# Patient Record
Sex: Female | Born: 2000 | Race: Black or African American | Hispanic: No | Smoking: Never smoker
Health system: Southern US, Community
[De-identification: ages and names within clinical notes are randomized; demographics above are authoritative.]

## PROBLEM LIST (undated history)

## (undated) DIAGNOSIS — L309 Dermatitis, unspecified: Secondary | ICD-10-CM

## (undated) DIAGNOSIS — F419 Anxiety disorder, unspecified: Secondary | ICD-10-CM

## (undated) DIAGNOSIS — E063 Autoimmune thyroiditis: Secondary | ICD-10-CM

## (undated) DIAGNOSIS — F32A Depression, unspecified: Secondary | ICD-10-CM

## (undated) DIAGNOSIS — E119 Type 2 diabetes mellitus without complications: Secondary | ICD-10-CM

## (undated) HISTORY — DX: Type 2 diabetes mellitus without complications: E11.9

## (undated) HISTORY — DX: Autoimmune thyroiditis: E06.3

## (undated) HISTORY — PX: HERNIA REPAIR: SHX51

## (undated) HISTORY — DX: Dermatitis, unspecified: L30.9

## (undated) HISTORY — DX: Depression, unspecified: F32.A

## (undated) HISTORY — DX: Anxiety disorder, unspecified: F41.9

---
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
## 2018-04-07 ENCOUNTER — Emergency Department (HOSPITAL_COMMUNITY)
Admission: EM | Admit: 2018-04-07 | Discharge: 2018-04-07 | Disposition: A | Discharge status: Home / Self Care | Payer: No Typology Code available for payment source | Attending: Emergency Medicine | Admitting: Emergency Medicine

## 2018-04-07 ENCOUNTER — Encounter (HOSPITAL_COMMUNITY): Payer: Self-pay | Admitting: Emergency Medicine

## 2018-04-07 ENCOUNTER — Emergency Department (HOSPITAL_COMMUNITY): Payer: No Typology Code available for payment source

## 2018-04-07 DIAGNOSIS — H5789 Other specified disorders of eye and adnexa: Secondary | ICD-10-CM | POA: Diagnosis not present

## 2018-04-07 DIAGNOSIS — T7840XA Allergy, unspecified, initial encounter: Secondary | ICD-10-CM | POA: Diagnosis not present

## 2018-04-07 DIAGNOSIS — R0789 Other chest pain: Secondary | ICD-10-CM | POA: Insufficient documentation

## 2018-04-07 DIAGNOSIS — R067 Sneezing: Secondary | ICD-10-CM | POA: Diagnosis present

## 2018-04-07 DIAGNOSIS — R05 Cough: Secondary | ICD-10-CM | POA: Insufficient documentation

## 2018-04-07 LAB — URINALYSIS, ROUTINE W REFLEX MICROSCOPIC
Bilirubin Urine: NEGATIVE
Glucose, UA: NEGATIVE mg/dL
Ketones, ur: NEGATIVE mg/dL
NITRITE: NEGATIVE
Protein, ur: NEGATIVE mg/dL
SPECIFIC GRAVITY, URINE: 1.012 (ref 1.005–1.030)
pH: 5 (ref 5.0–8.0)

## 2018-04-07 LAB — COMPREHENSIVE METABOLIC PANEL
ALBUMIN: 4.1 g/dL (ref 3.5–5.0)
ALT: 22 U/L (ref 0–44)
AST: 27 U/L (ref 15–41)
Alkaline Phosphatase: 72 U/L (ref 38–126)
Anion gap: 10 (ref 5–15)
BUN: 8 mg/dL (ref 6–20)
CHLORIDE: 106 mmol/L (ref 98–111)
CO2: 22 mmol/L (ref 22–32)
CREATININE: 0.9 mg/dL (ref 0.44–1.00)
Calcium: 9.3 mg/dL (ref 8.9–10.3)
GFR calc Af Amer: >60 mL/min (ref >60)
GFR calc non Af Amer: >60 mL/min (ref >60)
GLUCOSE: 115 mg/dL — AB (ref 70–99)
Potassium: 4.2 mmol/L (ref 3.5–5.1)
SODIUM: 138 mmol/L (ref 135–145)
Total Bilirubin: 0.9 mg/dL (ref 0.3–1.2)
Total Protein: 7.9 g/dL (ref 6.5–8.1)

## 2018-04-07 LAB — CBC
HEMATOCRIT: 33.5 % — AB (ref 36.0–46.0)
Hemoglobin: 10.7 g/dL — ABNORMAL LOW (ref 12.0–15.0)
MCH: 25.2 pg — ABNORMAL LOW (ref 26.0–34.0)
MCHC: 31.9 g/dL (ref 30.0–36.0)
MCV: 79 fL (ref 78.0–100.0)
Platelets: 413 10*3/uL — ABNORMAL HIGH (ref 150–400)
RBC: 4.24 MIL/uL (ref 3.87–5.11)
RDW: 16.1 % — AB (ref 11.5–15.5)
WBC: 7.7 10*3/uL (ref 4.0–10.5)

## 2018-04-07 LAB — LIPASE, BLOOD: LIPASE: 41 U/L (ref 11–51)

## 2018-04-07 LAB — PREGNANCY, URINE: PREG TEST UR: NEGATIVE

## 2018-04-07 MED ORDER — FLUTICASONE PROPIONATE 50 MCG/ACT NA SUSP: 1.0000 | Freq: Every day | NASAL | 0 refills | Status: DC

## 2018-04-07 MED ORDER — CETIRIZINE HCL 10 MG PO CAPS: 1.0000 | ORAL_CAPSULE | Freq: Every day | ORAL | 0 refills | Status: DC

## 2018-04-07 MED ORDER — LORATADINE 10 MG PO TABS
10.0000 mg | ORAL_TABLET | Freq: Every day | ORAL | Status: DC
  Administered 2018-04-07: 10 mg via ORAL
  Filled 2018-04-07: qty 1

## 2018-04-07 MED ORDER — CETIRIZINE HCL 5 MG/5ML PO SOLN: 10.0000 mg | Freq: Every day | ORAL | Status: DC

## 2018-04-07 NOTE — ED Provider Notes (Signed)
[facility] EMERGENCY DEPARTMENT Provider Note   CSN: [id number] Arrival date & time: 04/07/18  1042     History   Chief Complaint Chief Complaint  Patient presents with  . Nausea  . Abdominal Pain    HPI Julie Becker is a 17 y.o. female presenting with sneezing, itching and watery eyes along with a nonproductive cough which started yesterday shortly after mowing her lawn.  She reports a history of allergy to grass and usually takes zyrtec and flonase daily during the summertime but ran out of this medicine over a month ago. She reports having nausea early this morning which has resolved after eating breakfast.  She denies shortness of breath, chest pain, but did have a heavy feeling in her chest last night and endorses wheezing for "awhile".  She denies abdominal pain, vomiting, fevers, chills or other complaint.  The history is provided by the patient and a parent.    History reviewed. No pertinent past medical history.  There are no active problems to display for this patient.   History reviewed. No pertinent surgical history.   OB History   None      Home Medications    Prior to Admission medications   Medication Sig Start Date End Date Taking? Authorizing Provider  Cetirizine HCl (ZYRTEC ALLERGY) 10 MG CAPS Take 1 capsule (10 mg total) by mouth daily. 04/07/18   Evalee Jefferson, PA-C  fluticasone (FLONASE) 50 MCG/ACT nasal spray Place 1 spray into both nostrils daily. 04/07/18   Evalee Jefferson, PA-C    Family History No family history on file.  Social History Social History   Tobacco Use  . Smoking status: Never Smoker  . Smokeless tobacco: Never Used  Substance Use Topics  . Alcohol use: Not Currently  . Drug use: Never     Allergies   Patient has no known allergies.   Review of Systems Review of Systems  Constitutional: Negative for chills and fever.  HENT: Positive for congestion, rhinorrhea and sneezing. Negative for ear pain, sinus pressure,  sore throat, trouble swallowing and voice change.   Eyes: Negative for discharge.  Respiratory: Positive for cough and wheezing. Negative for shortness of breath and stridor.   Cardiovascular: Negative for chest pain.  Gastrointestinal: Positive for nausea. Negative for abdominal pain and vomiting.  Genitourinary: Negative.      Physical Exam Updated Vital Signs BP 137/76 (BP Location: Left Arm)   Pulse 98   Temp 98.7 F (37.1 C) (Oral)   Resp 15   Ht 5\' 1"  (1.549 m)   Wt 136.1 kg (300 lb)   LMP 03/07/2018   SpO2 100%   BMI 56.68 kg/m   Physical Exam  Constitutional: She is oriented to person, place, and time. She appears well-developed and well-nourished.  HENT:  Head: Normocephalic and atraumatic.  Right Ear: Tympanic membrane and ear canal normal.  Left Ear: Tympanic membrane and ear canal normal.  Nose: Mucosal edema and rhinorrhea present.  Mouth/Throat: Uvula is midline, oropharynx is clear and moist and mucous membranes are normal. No oropharyngeal exudate, posterior oropharyngeal edema, posterior oropharyngeal erythema or tonsillar abscesses.  Eyes: Conjunctivae are normal.  Cardiovascular: Normal rate and normal heart sounds.  Pulmonary/Chest: Effort normal. No stridor. No respiratory distress. She has no decreased breath sounds. She has no wheezes. She has no rhonchi. She has no rales.  Abdominal: Soft. There is no tenderness.  Musculoskeletal: Normal range of motion.  Neurological: She is alert and oriented to person, place, and  time.  Skin: Skin is warm and dry. No rash noted.  Psychiatric: She has a normal mood and affect.     ED Treatments / Results  Labs (all labs ordered are listed, but only abnormal results are displayed) Labs Reviewed  COMPREHENSIVE METABOLIC PANEL - Abnormal; Notable for the following components:      Result Value   Glucose, Bld 115 (*)    All other components within normal limits  CBC - Abnormal; Notable for the following  components:   Hemoglobin 10.7 (*)    HCT 33.5 (*)    MCH 25.2 (*)    RDW 16.1 (*)    Platelets 413 (*)    All other components within normal limits  URINALYSIS, ROUTINE W REFLEX MICROSCOPIC - Abnormal; Notable for the following components:   APPearance HAZY (*)    Hgb urine dipstick MODERATE (*)    Leukocytes, UA TRACE (*)    Bacteria, UA RARE (*)    All other components within normal limits  LIPASE, BLOOD  PREGNANCY, URINE    EKG None  Radiology Dg Chest 2 View  Result Date: 04/07/2018 CLINICAL DATA:  Cough, shortness of breath EXAM: CHEST - 2 VIEW COMPARISON:  None. FINDINGS: The heart size and mediastinal contours are within normal limits. Both lungs are clear. The visualized skeletal structures are unremarkable. IMPRESSION: No active cardiopulmonary disease. Electronically Signed   By: Kathreen Devoid   On: 04/07/2018 13:02    Procedures Procedures (including critical care time)  Medications Ordered in ED Medications  loratadine (CLARITIN) tablet 10 mg (10 mg Oral Given 04/07/18 1258)     Initial Impression / Assessment and Plan / ED Course  I have reviewed the triage vital signs and the nursing notes.  Pertinent labs & imaging results that were available during my care of the patient were reviewed by me and considered in my medical decision making (see chart for details).     Imaging and labs reviewed and discussed.  Of note, chief complaint suggests abdominal pain, pt denies this sx.  She does have anemia which was discussed, reports has heavy periods and is currently menstruating x 2 weeks, is seeing gyn for this problem, this explains the hematuria as well as this was a clean catch specimen.  CXR clear.  Pt with no respiratory compromise.  Sx suggesting allergic rhinitis/reaction to grass exposure.  She was given refills of her zyrtec and her flonase.  Advised f/u with pcp prn, states has appt with her new provider in [place] mid July.  Final Clinical Impressions(s) / ED  Diagnoses   Final diagnoses:  Allergic reaction, initial encounter    ED Discharge Orders        Ordered    Cetirizine HCl (ZYRTEC ALLERGY) 10 MG CAPS  Daily     04/07/18 1350    fluticasone (FLONASE) 50 MCG/ACT nasal spray  Daily     04/07/18 1350       Evalee Jefferson, PA-C 04/08/18 4854    Lajean Saver, MD 04/08/18 [phone]

## 2018-04-07 NOTE — ED Triage Notes (Signed)
Patient complains of nausea, sneezing and watery itchy eyes. Pt states symptoms started after mowing grass yesterday.

## 2018-04-10 ENCOUNTER — Encounter (HOSPITAL_COMMUNITY): Payer: Self-pay

## 2018-04-10 DIAGNOSIS — R0789 Other chest pain: Secondary | ICD-10-CM | POA: Diagnosis present

## 2018-04-10 NOTE — ED Triage Notes (Signed)
Pt states she was seen here on Tuesday, evaluated for some sharp pain and pressure in her chest, states pain has continued.

## 2018-04-11 ENCOUNTER — Other Ambulatory Visit: Payer: Self-pay

## 2018-04-11 ENCOUNTER — Emergency Department (HOSPITAL_COMMUNITY)
Admission: EM | Admit: 2018-04-11 | Discharge: 2018-04-11 | Disposition: A | Discharge status: Home / Self Care | Payer: No Typology Code available for payment source | Attending: Emergency Medicine | Admitting: Emergency Medicine

## 2018-04-11 ENCOUNTER — Emergency Department (HOSPITAL_COMMUNITY): Payer: No Typology Code available for payment source

## 2018-04-11 DIAGNOSIS — R0789 Other chest pain: Secondary | ICD-10-CM

## 2018-04-11 LAB — COMPREHENSIVE METABOLIC PANEL
ALBUMIN: 3.8 g/dL (ref 3.5–5.0)
ALT: 16 U/L (ref 0–44)
ANION GAP: 8 (ref 5–15)
AST: 21 U/L (ref 15–41)
Alkaline Phosphatase: 60 U/L (ref 38–126)
BUN: 10 mg/dL (ref 6–20)
CHLORIDE: 108 mmol/L (ref 98–111)
CO2: 23 mmol/L (ref 22–32)
Calcium: 8.9 mg/dL (ref 8.9–10.3)
Creatinine, Ser: 0.91 mg/dL (ref 0.44–1.00)
GFR calc Af Amer: >60 mL/min (ref >60)
GFR calc non Af Amer: >60 mL/min (ref >60)
GLUCOSE: 95 mg/dL (ref 70–99)
POTASSIUM: 4 mmol/L (ref 3.5–5.1)
SODIUM: 139 mmol/L (ref 135–145)
Total Bilirubin: 0.6 mg/dL (ref 0.3–1.2)
Total Protein: 7.4 g/dL (ref 6.5–8.1)

## 2018-04-11 LAB — CBC
HEMATOCRIT: 32.8 % — AB (ref 36.0–46.0)
HEMOGLOBIN: 10.3 g/dL — AB (ref 12.0–15.0)
MCH: 25.2 pg — ABNORMAL LOW (ref 26.0–34.0)
MCHC: 31.4 g/dL (ref 30.0–36.0)
MCV: 80.2 fL (ref 78.0–100.0)
Platelets: 352 10*3/uL (ref 150–400)
RBC: 4.09 MIL/uL (ref 3.87–5.11)
RDW: 15.9 % — AB (ref 11.5–15.5)
WBC: 8.4 10*3/uL (ref 4.0–10.5)

## 2018-04-11 LAB — I-STAT TROPONIN, ED: Troponin i, poc: 0 ng/mL (ref 0.00–0.08)

## 2018-04-11 LAB — D-DIMER, QUANTITATIVE: D-Dimer, Quant: 0.58 ug/mL-FEU — ABNORMAL HIGH (ref 0.00–0.50)

## 2018-04-11 MED ORDER — IOPAMIDOL (ISOVUE-370) INJECTION 76%
100.0000 mL | Freq: Once | INTRAVENOUS | Status: AC | PRN
  Administered 2018-04-11: 100 mL via INTRAVENOUS

## 2018-04-11 MED ORDER — NAPROXEN 500 MG PO TABS: 500.0000 mg | ORAL_TABLET | Freq: Two times a day (BID) | ORAL | 0 refills | Status: DC

## 2018-04-11 MED ORDER — PANTOPRAZOLE SODIUM 40 MG PO TBEC: 40.0000 mg | DELAYED_RELEASE_TABLET | Freq: Every day | ORAL | 0 refills | Status: DC

## 2018-04-11 NOTE — ED Provider Notes (Signed)
[facility] EMERGENCY DEPARTMENT Provider Note   CSN: [id number] Arrival date & time: 04/10/18  2303     History   Chief Complaint Chief Complaint  Patient presents with  . Chest Pain    HPI Julie Becker is a 17 y.o. female.  Patient presents to the ER for evaluation of chest pain.  Patient reports pain in the upper area of her left chest that has been ongoing for several days.  Pain does come and go.  She reports that when she is up and moving around during the day she does not feel it, but when she lies down she feels slightly short of breath and there is a sharp pain present.  She denies any injury.     History reviewed. No pertinent past medical history.  There are no active problems to display for this patient.   History reviewed. No pertinent surgical history.   OB History   None      Home Medications    Prior to Admission medications   Medication Sig Start Date End Date Taking? Authorizing Provider  Cetirizine HCl (ZYRTEC ALLERGY) 10 MG CAPS Take 1 capsule (10 mg total) by mouth daily. 04/07/18   Evalee Jefferson, PA-C  fluticasone (FLONASE) 50 MCG/ACT nasal spray Place 1 spray into both nostrils daily. 04/07/18   Evalee Jefferson, PA-C  naproxen (NAPROSYN) 500 MG tablet Take 1 tablet (500 mg total) by mouth 2 (two) times daily. 04/11/18   Orpah Greek, MD  pantoprazole (PROTONIX) 40 MG tablet Take 1 tablet (40 mg total) by mouth daily. 04/11/18   Orpah Greek, MD    Family History No family history on file.  Social History Social History   Tobacco Use  . Smoking status: Never Smoker  . Smokeless tobacco: Never Used  Substance Use Topics  . Alcohol use: Not Currently  . Drug use: Never     Allergies   Patient has no known allergies.   Review of Systems Review of Systems  Respiratory: Positive for shortness of breath.   Cardiovascular: Positive for chest pain.  All other systems reviewed and are negative.    Physical  Exam Updated Vital Signs BP (!) 141/93 (BP Location: Left Arm)   Pulse (!) 104   Temp 98.8 F (37.1 C) (Oral)   Resp 18   Ht 5\' 2"  (1.575 m)   Wt 134.7 kg (297 lb)   SpO2 100%   BMI 54.32 kg/m   Physical Exam  Constitutional: She is oriented to person, place, and time. She appears well-developed and well-nourished. No distress.  HENT:  Head: Normocephalic and atraumatic.  Right Ear: Hearing normal.  Left Ear: Hearing normal.  Nose: Nose normal.  Mouth/Throat: Oropharynx is clear and moist and mucous membranes are normal.  Eyes: Pupils are equal, round, and reactive to light. Conjunctivae and EOM are normal.  Neck: Normal range of motion. Neck supple.  Cardiovascular: Regular rhythm, S1 normal and S2 normal. Exam reveals no gallop and no friction rub.  No murmur heard. Pulmonary/Chest: Effort normal and breath sounds normal. No respiratory distress. She exhibits tenderness.    Abdominal: Soft. Normal appearance and bowel sounds are normal. There is no hepatosplenomegaly. There is no tenderness. There is no rebound, no guarding, no tenderness at McBurney's point and negative Murphy's sign. No hernia.  Musculoskeletal: Normal range of motion.  Neurological: She is alert and oriented to person, place, and time. She has normal strength. No cranial nerve deficit or sensory deficit. Coordination  normal. GCS eye subscore is 4. GCS verbal subscore is 5. GCS motor subscore is 6.  Skin: Skin is warm, dry and intact. No rash noted. No cyanosis.  Psychiatric: She has a normal mood and affect. Her speech is normal and behavior is normal. Thought content normal.  Nursing note and vitals reviewed.    ED Treatments / Results  Labs (all labs ordered are listed, but only abnormal results are displayed) Labs Reviewed  CBC - Abnormal; Notable for the following components:      Result Value   Hemoglobin 10.3 (*)    HCT 32.8 (*)    MCH 25.2 (*)    RDW 15.9 (*)    All other components within  normal limits  D-DIMER, QUANTITATIVE (NOT AT [facility]) - Abnormal; Notable for the following components:   D-Dimer, Quant 0.58 (*)    All other components within normal limits  COMPREHENSIVE METABOLIC PANEL  I-STAT TROPONIN, ED    EKG EKG Interpretation  Date/Time:  Friday April 10 2018 23:17:24 EDT Ventricular Rate:  110 PR Interval:  140 QRS Duration: 72 QT Interval:  322 QTC Calculation: 435 R Axis:   26 Text Interpretation:  Sinus tachycardia Otherwise normal ECG Confirmed by Orpah Greek [phone]) on 04/11/2018 1:20:29 AM   Radiology Ct Angio Chest Pe W Or Wo Contrast  Result Date: 04/11/2018 CLINICAL DATA:  Chest pain and shortness of breath EXAM: CT ANGIOGRAPHY CHEST WITH CONTRAST TECHNIQUE: Multidetector CT imaging of the chest was performed using the standard protocol during bolus administration of intravenous contrast. Multiplanar CT image reconstructions and MIPs were obtained to evaluate the vascular anatomy. CONTRAST:  124mL ISOVUE-370 IOPAMIDOL (ISOVUE-370) INJECTION 76% COMPARISON:  None. FINDINGS: Cardiovascular: --Pulmonary arteries: Contrast injection is sufficient to demonstrate satisfactory opacification of the pulmonary arteries to the segmental level. There is no pulmonary embolus. The main pulmonary artery is within normal limits for size. --Aorta: Limited opacification of the aorta due to bolus timing optimization for the pulmonary arteries. Conventional 3 vessel aortic branching pattern. The aortic course and caliber are normal. There is no aortic atherosclerosis. --Heart: Normal size. No pericardial effusion. Mediastinum/Nodes: No mediastinal, hilar or axillary lymphadenopathy. The visualized thyroid and thoracic esophageal course are unremarkable. Lungs/Pleura: No pulmonary nodules or masses. No pleural effusion or pneumothorax. No focal airspace consolidation. No focal pleural abnormality. Upper Abdomen: Contrast bolus timing is not optimized for evaluation of  the abdominal organs. Within this limitation, the visualized organs of the upper abdomen are normal. Musculoskeletal: No chest wall abnormality. No acute or significant osseous findings. Review of the MIP images confirms the above findings. IMPRESSION: No pulmonary embolus or other acute thoracic abnormality. Electronically Signed   By: Ulyses Jarred M.D.   On: 04/11/2018 04:03    Procedures Procedures (including critical care time)  Medications Ordered in ED Medications  iopamidol (ISOVUE-370) 76 % injection 100 mL (100 mLs Intravenous Contrast Given 04/11/18 0338)     Initial Impression / Assessment and Plan / ED Course  I have reviewed the triage vital signs and the nursing notes.  Pertinent labs & imaging results that were available during my care of the patient were reviewed by me and considered in my medical decision making (see chart for details).     Patient presents to the emergency department for evaluation of chest pain.  Patient experiencing left upper chest pain.  There is reproducibility noted here in the ER.  She does, however, report exogenous estrogen use and was mildly tachycardic at arrival.  Therefore could not rule out PE.  She was felt to have a low likelihood for PE, however.  D-dimer was slightly elevated and therefore she underwent CT scan.  GI did not show any evidence of PE or other pathology.  Patient reassured, will treat for musculoskeletal chest pain.  She does report some reflux type symptoms as well.  Must consider possibility of GI cause of her discomfort.  Will add PPI.  Final Clinical Impressions(s) / ED Diagnoses   Final diagnoses:  Chest wall pain    ED Discharge Orders        Ordered    pantoprazole (PROTONIX) 40 MG tablet  Daily     04/11/18 0410    naproxen (NAPROSYN) 500 MG tablet  2 times daily     04/11/18 0410       Orpah Greek, MD 04/11/18 0410

## 2019-09-27 ENCOUNTER — Encounter: Payer: Medicaid Other | Attending: General Practice | Admitting: Nutrition

## 2019-09-27 ENCOUNTER — Encounter: Payer: Self-pay | Admitting: Nutrition

## 2019-09-27 ENCOUNTER — Other Ambulatory Visit: Payer: Self-pay

## 2019-09-27 VITALS — Ht 62.0 in | Wt 337.0 lb

## 2019-09-27 DIAGNOSIS — Z6841 Body Mass Index (BMI) 40.0 and over, adult: Secondary | ICD-10-CM

## 2019-09-27 DIAGNOSIS — E118 Type 2 diabetes mellitus with unspecified complications: Secondary | ICD-10-CM | POA: Insufficient documentation

## 2019-09-27 DIAGNOSIS — IMO0002 Reserved for concepts with insufficient information to code with codable children: Secondary | ICD-10-CM

## 2019-09-27 DIAGNOSIS — E1165 Type 2 diabetes mellitus with hyperglycemia: Secondary | ICD-10-CM | POA: Insufficient documentation

## 2019-09-27 NOTE — Progress Notes (Signed)
  Medical Nutrition Therapy:  Appt start time: [id number] end time:  1630.   Assessment:  Primary concerns today: DIabetes Type 2, Obesity. Goes to [facility].. A1C 6.5% per patient. LIves with her mom. Just got diagnosed with Type 2 DM.Marland Kitchen Not on any medications. Going to try and control it with diet and exercise. Eats 1-2 meals per day. Works at [employer]. Has cut out juice. Does drink gatorade Walks a little. Admits to skipping meals often. States she has been heavy since she was 18 yrs old. Willing to make changes with diet and exercise.  Preferred Learning Style:   No preference indicated   Learning Readiness:  Ready  Change in progress   MEDICATIONS:   DIETARY INTAKE:  24-hr recall:  B ( AM): skips mostly or yogurt or cereal and almond milk Snk ( AM):  L ( PM): CHicken salad and grapes, crackers,macaroni noodles, 1 cup,  Poweraide Snk ( PM): grapers 20,  water D ( PM): chicken and deet stew, green bean/carrots/poatoes, -1 1/2 cups, unsalted 10  Crackers,  gartorade Snk ( PM):  Beverages: water, gatorade.  Usual physical activity: walking  Estimated energy needs: 1500  calories 170 g carbohydrates 112 g protein 42 g fat  Progress Towards Goal(s):  In progress.   Nutritional Diagnosis:  NB-1.1 Food and nutrition-related knowledge deficit As related to Diabetes.  As evidenced by A1C > 6.5%.    Intervention:  Nutrition and Diabetes education provided on My Plate, CHO counting, meal planning, portion sizes, timing of meals, avoiding snacks between meals unless having a low blood sugar, target ranges for A1C and blood sugars, signs/symptoms and treatment of hyper/hypoglycemia, monitoring blood sugars, taking medications as prescribed, benefits of exercising 30 minutes per day and prevention of complications of DM.  Marland KitchenGoal  Follow My Plate  Eat three meals per day Exercise 30 minutes 3-4 times per week Cut Gatorade and drink only water Lose 1-2 lbs  per week Don't skip meal. Get A1C down to 5.9%  Teaching Method Utilized:  Visual Auditory Hands on  Handouts given during visit include:  The Plate Method  Meal Plan Card  Diabetes Instructions.   Barriers to learning/adherence to lifestyle change: none  Demonstrated degree of understanding via:  Teach Back   Monitoring/Evaluation:  Dietary intake, exercise,  and body weight in 1 month(s).

## 2019-09-27 NOTE — Patient Instructions (Addendum)
Goal  Follow My Plate  Eat three meals per day Exercise 30 minutes 3-4 times per week Cut Gatorade and drink only water Lose 1-2 lbs per week Don't skip meal. Get A1C down to 5.9%

## 2019-10-07 ENCOUNTER — Other Ambulatory Visit: Payer: Self-pay

## 2019-10-07 ENCOUNTER — Ambulatory Visit: Payer: Medicaid Other | Attending: Internal Medicine

## 2019-10-07 DIAGNOSIS — Z20822 Contact with and (suspected) exposure to covid-19: Secondary | ICD-10-CM

## 2019-10-08 LAB — NOVEL CORONAVIRUS, NAA: SARS-CoV-2, NAA: NOT DETECTED

## 2019-10-28 ENCOUNTER — Ambulatory Visit: Payer: Medicaid Other | Admitting: Nutrition

## 2019-11-18 ENCOUNTER — Encounter: Payer: Medicaid Other | Attending: Family | Admitting: Nutrition

## 2019-11-18 ENCOUNTER — Other Ambulatory Visit: Payer: Self-pay

## 2019-11-18 ENCOUNTER — Encounter: Payer: Self-pay | Admitting: Nutrition

## 2019-11-18 DIAGNOSIS — E118 Type 2 diabetes mellitus with unspecified complications: Secondary | ICD-10-CM | POA: Insufficient documentation

## 2019-11-18 DIAGNOSIS — E1165 Type 2 diabetes mellitus with hyperglycemia: Secondary | ICD-10-CM | POA: Insufficient documentation

## 2019-11-18 DIAGNOSIS — IMO0002 Reserved for concepts with insufficient information to code with codable children: Secondary | ICD-10-CM

## 2019-11-18 DIAGNOSIS — Z6841 Body Mass Index (BMI) 40.0 and over, adult: Secondary | ICD-10-CM | POA: Insufficient documentation

## 2019-11-18 NOTE — Progress Notes (Signed)
Mychart video visit. She has back at school at [employer]. Medical Nutrition Therapy:  Appt start time: 1500 end time:  [id number].   Assessment:  Primary concerns today: DIabetes Type 2, Obesity. Goes to [facility].. A1C 6.5% per patient. Changed: Quit drinking Gatorade. Has been cutting down on snacking and eating more vegetables. Has cut down on eating out. Walking now more. Feels better. Thinks her last weight at MD office owas 327 lbs. Kept a food journal and is more aware of what she is eating now. 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 Walking to class now for exercise.  Preferred Learning Style:   No preference indicated   Learning Readiness:  Ready  Change in progress   MEDICATIONS:   DIETARY INTAKE:  24-hr recall:  B ( AM): Eating breakfast now Snk ( AM):  L ( PM): CHicken salad and grapes, crackers, fruit, water  Snk ( PM): grapes 20,  water D ( PM): Chicken and creamed spinach, water Snk ( PM):  Beverages: water, gatorade.  Usual physical activity: walking  Estimated energy needs: 1500  calories 170 g carbohydrates 112 g protein 42 g fat  Progress Towards Goal(s):  In progress.   Nutritional Diagnosis:  NB-1.1 Food and nutrition-related knowledge deficit As related to Diabetes.  As evidenced by A1C > 6.5%.    Intervention:  Nutrition and Diabetes education provided on My Plate, CHO counting, meal planning, portion sizes, timing of meals, avoiding snacks between meals unless having a low blood sugar, target ranges for A1C and blood sugars, signs/symptoms and treatment of hyper/hypoglycemia, monitoring blood sugars, taking medications as prescribed, benefits of exercising 30 minutes per day and prevention of complications of DM.  Marland Kitchen Goals  Continue to increase fresh fruits and vegetables Don't skip meals  Keep drinking water  Exercise by walking 30 minutes a day. Avoid fast foods Keep up the great job of eating healthier foods and less process  foods. Keep food journal. Lose 10 lbs by next visit.  Teaching Method Utilized:  Visual Auditory Hands on  Handouts given during visit include:  The Plate Method  Meal Plan Card  Diabetes Instructions.   Barriers to learning/adherence to lifestyle change: none  Demonstrated degree of understanding via:  Teach Back   Monitoring/Evaluation:  Dietary intake, exercise,  and body weight in 1 month(s). She would benefit from Metformin 500 mg BID for metabolic syndrome as well as her new diabetes.

## 2019-11-18 NOTE — Patient Instructions (Addendum)
Goals  Continue to increase fresh fruits and vegetables Don't skip meals  Keep drinking water  Exercise by walking 30 minutes a day. Avoid fast foods Keep up the great job of eating healthier foods and less process foods. Keep food journal. Lose 10 lbs by next visit.

## 2020-01-05 ENCOUNTER — Telehealth: Payer: Medicaid Other | Admitting: Nutrition

## 2020-02-02 ENCOUNTER — Other Ambulatory Visit: Payer: Self-pay

## 2020-02-02 ENCOUNTER — Encounter: Payer: Medicaid Other | Attending: Family | Admitting: Nutrition

## 2020-02-02 DIAGNOSIS — E1165 Type 2 diabetes mellitus with hyperglycemia: Secondary | ICD-10-CM | POA: Insufficient documentation

## 2020-02-02 DIAGNOSIS — E118 Type 2 diabetes mellitus with unspecified complications: Secondary | ICD-10-CM | POA: Insufficient documentation

## 2020-02-02 DIAGNOSIS — Z6841 Body Mass Index (BMI) 40.0 and over, adult: Secondary | ICD-10-CM | POA: Insufficient documentation

## 2020-02-02 DIAGNOSIS — IMO0002 Reserved for concepts with insufficient information to code with codable children: Secondary | ICD-10-CM

## 2020-02-02 NOTE — Progress Notes (Signed)
Mychart video visit. She has back at school at [employer]. Medical Nutrition Therapy:  Appt start time: 1130 end time:  1200  Assessment:  Primary concerns today: DIabetes Type 2- A1C 6.5%(per patient) . and Morbid  Obesity. Goes to [employer] [employer] [facility]..  Trying to work on eating better and more fruits and vegetables. Drinking a lot more water. Doesn't know if she has lost any weight. She notes she struggles with tempting foods and eating in the cafeteria. Has food in her room that is healthy sometimes. Is under stress from all school work and feels this causes her to overeat and eat the wrong things.  Her friends are supportive of her efforts to lose weight.    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 Walking to class now for exercise.  Preferred Learning Style:   No preference indicated   Learning Readiness:  Ready  Change in progress   MEDICATIONS:   DIETARY INTAKE:  24-hr recall:  B ( AM): skips some or cereal or omelet. Snk ( AM):  L ( PM): grilled chicken, potaotes/onions on pita bread,water Snk ( PM): grapes 20,  water D ( PM): Chicken crock pot, peppers, onion, broccoli, water Snk ( PM):  Beverages: water, gatorade.  Usual physical activity: walking  Estimated energy needs: 1500  calories 170 g carbohydrates 112 g protein 42 g fat  Progress Towards Goal(s):  In progress.   Nutritional Diagnosis:  NB-1.1 Food and nutrition-related knowledge deficit As related to Diabetes.  As evidenced by A1C > 6.5%.    Intervention:  Nutrition and Diabetes education provided on My Plate, CHO counting, meal planning, portion sizes, timing of meals, avoiding snacks between meals unless having a low blood sugar, target ranges for A1C and blood sugars, signs/symptoms and treatment of hyper/hypoglycemia, monitoring blood sugars, taking medications as prescribed, benefits of exercising 30 minutes per day and prevention of complications of DM.  Teaching MGoals  Walk 45 minutes   daily. Do workout utube video 4 times per week. Choose healthier choices with meals Keep drinking only water. Don't skip meals. Lose 1-2 lbs per week.  ethod Utilized:  Visual Auditory Hands on  Handouts given during visit include:  The Plate Method  Meal Plan Card  Diabetes Instructions.   Barriers to learning/adherence to lifestyle change: none  Demonstrated degree of understanding via:  Teach Back   Monitoring/Evaluation:  Dietary intake, exercise,  and body weight in 3 month(s). She would benefit from Metformin 500 mg BID for metabolic syndrome as well as her new diabetes. Would benefit from a CMET and lipid profile if not already done.

## 2020-02-02 NOTE — Patient Instructions (Addendum)
Goals  Walk 45 minutes  daily. Do workout utube video 4 times per week. Choose healthier choices with meals Keep drinking only water. Don't skip meals. Lose 1-2 lbs per week.

## 2020-02-07 ENCOUNTER — Encounter: Payer: Self-pay | Admitting: Nutrition

## 2020-03-30 ENCOUNTER — Ambulatory Visit: Payer: Medicaid Other | Admitting: Nutrition

## 2021-11-07 ENCOUNTER — Emergency Department (HOSPITAL_COMMUNITY)
Admission: EM | Admit: 2021-11-07 | Discharge: 2021-11-08 | Disposition: A | Discharge status: Home / Self Care | Payer: 59 | Attending: Emergency Medicine | Admitting: Emergency Medicine

## 2021-11-07 ENCOUNTER — Encounter (HOSPITAL_COMMUNITY): Payer: Self-pay

## 2021-11-07 DIAGNOSIS — R5383 Other fatigue: Secondary | ICD-10-CM | POA: Insufficient documentation

## 2021-11-07 DIAGNOSIS — Y990 Civilian activity done for income or pay: Secondary | ICD-10-CM | POA: Diagnosis not present

## 2021-11-07 DIAGNOSIS — R42 Dizziness and giddiness: Secondary | ICD-10-CM | POA: Insufficient documentation

## 2021-11-07 DIAGNOSIS — R110 Nausea: Secondary | ICD-10-CM | POA: Insufficient documentation

## 2021-11-07 DIAGNOSIS — R519 Headache, unspecified: Secondary | ICD-10-CM | POA: Diagnosis not present

## 2021-11-07 DIAGNOSIS — T5891XA Toxic effect of carbon monoxide from unspecified source, accidental (unintentional), initial encounter: Secondary | ICD-10-CM | POA: Insufficient documentation

## 2021-11-07 DIAGNOSIS — Z7729 Contact with and (suspected ) exposure to other hazardous substances: Secondary | ICD-10-CM

## 2021-11-07 NOTE — ED Triage Notes (Signed)
Patient arrives from home with complaint of carbon monoxide exposure that occurred at work. Pt endorses headache, nausea, and feeling tired.

## 2021-11-08 LAB — COOXEMETRY PANEL
Carboxyhemoglobin: 0.7 % (ref 0.5–1.5)
Methemoglobin: 1.5 % (ref 0.0–1.5)
O2 Saturation: 41.4 %
Total hemoglobin: 12.7 g/dL (ref 12.0–16.0)
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
## 2021-11-28 ENCOUNTER — Other Ambulatory Visit: Payer: Self-pay | Admitting: Family Medicine

## 2021-11-28 ENCOUNTER — Other Ambulatory Visit (HOSPITAL_COMMUNITY): Payer: Self-pay | Admitting: Family Medicine

## 2021-11-28 DIAGNOSIS — D259 Leiomyoma of uterus, unspecified: Secondary | ICD-10-CM

## 2021-11-28 DIAGNOSIS — R109 Unspecified abdominal pain: Secondary | ICD-10-CM

## 2021-12-13 ENCOUNTER — Ambulatory Visit (HOSPITAL_COMMUNITY): Payer: 59

## 2021-12-13 ENCOUNTER — Encounter (HOSPITAL_COMMUNITY): Payer: Self-pay

## 2021-12-20 ENCOUNTER — Other Ambulatory Visit: Payer: Self-pay | Admitting: Family Medicine

## 2021-12-20 ENCOUNTER — Other Ambulatory Visit (HOSPITAL_COMMUNITY): Payer: Self-pay | Admitting: Family Medicine

## 2021-12-20 DIAGNOSIS — D259 Leiomyoma of uterus, unspecified: Secondary | ICD-10-CM

## 2021-12-27 ENCOUNTER — Encounter: Payer: Self-pay | Admitting: Obstetrics & Gynecology

## 2021-12-27 ENCOUNTER — Ambulatory Visit (INDEPENDENT_AMBULATORY_CARE_PROVIDER_SITE_OTHER): Payer: 59 | Admitting: Obstetrics & Gynecology

## 2021-12-27 ENCOUNTER — Other Ambulatory Visit: Payer: Self-pay | Admitting: Obstetrics & Gynecology

## 2021-12-27 ENCOUNTER — Other Ambulatory Visit: Payer: Self-pay

## 2021-12-27 ENCOUNTER — Other Ambulatory Visit (INDEPENDENT_AMBULATORY_CARE_PROVIDER_SITE_OTHER): Payer: 59

## 2021-12-27 VITALS — BP 141/91 | HR 94 | Ht 62.0 in | Wt 353.0 lb

## 2021-12-27 DIAGNOSIS — D251 Intramural leiomyoma of uterus: Secondary | ICD-10-CM

## 2021-12-27 DIAGNOSIS — D219 Benign neoplasm of connective and other soft tissue, unspecified: Secondary | ICD-10-CM

## 2021-12-27 NOTE — Progress Notes (Signed)
PELVIC US TA/TV: heterogeneous anteflexed uterus with multiple fibroids,(#1) calcified fundal intramural fibroid 2.3 x 1.9 x 2 cm,(#2) left mid intramural fibroid 1.7 x 1.6 x 1.7 cm,normal ovaries,EEC 10.4 mm,no free fluid ? ?Chaperone Tammy ?

## 2021-12-27 NOTE — Progress Notes (Signed)
Follow up appointment for results ? ?Chief Complaint  ?Patient presents with  ? Fibroids  ? ? ?Blood pressure (!) 141/91, pulse 94, height '5\' 2"'$  (1.575 m), weight (!) 353 lb (160.1 kg). ? ?Patient presents with a history of having fibroids ?We were fortunate be able to do a sonogram today which was essentially normal except for a very small 1.7 cm fibroid which is clinically completely insignificant ? ?US PELVIC COMPLETE WITH TRANSVAGINAL ? ?Result Date: 12/31/2021 ?Images from the original result were not included.  ..an Sales executive of Ultrasound Medicine Diplomatic Services operational officer) accredited practice [facility] @ [address] [address] Ordering Provider: Florian Buff, MD                                                                                                                                   GYNECOLOGIC SONOGRAM MADELINA Becker is a 21 y.o. G0P0000 No LMP recorded. (Menstrual status: Oral contraceptives). She is here for a pelvic sonogram for fibroids. Uterus                      5.6 x 4.6 x 5.5 cm, Total uterine volume 81 cc, heterogeneous anteflexed uterus with multiple fibroids,(#1) calcified fundal intramural fibroid 2.3 x 1.9 x 2 cm,(#2) left mid intramural fibroid 1.7 x 1.6 x 1.7 cm Endometrium          10.4 mm, symmetrical, wnl Right ovary             2.1 x 1.8 x 2 cm, wnl Left ovary                2.5 x 2.2 x 2.4 cm, wnl No free fluid Technician Comments: PELVIC US TA/TV: heterogeneous anteflexed uterus with multiple fibroids,(#1) calcified fundal intramural fibroid 2.3 x 1.9 x 2 cm,(#2) left mid intramural fibroid 1.7 x 1.6 x 1.7 cm,normal ovaries,EEC 10.4 mm,no free fluid Chaperone [address] Heide Guile 12/27/2021 11:25 AM Clinical Impression and recommendations: I have reviewed the sonogram results above, combined with the patient's current clinical course, below are my impressions and any appropriate recommendations for management based on the sonographic  findings. Uterus is overall normal size shape and contour, small, clinically insignificant fibroids noted Endometrium normal width in a menstrual age woman Ovaries: normal size shape and morphology Normal pelvic sonogram Florian Buff 12/31/2021 6:16 PM   ? ? ? ?MEDS ordered this encounter: ?No orders of the defined types were placed in this encounter. ? ? ?Orders for this encounter: ?No orders of the defined types were placed in this encounter. ? ? ?Impression: ?  ICD-10-CM   ?1. Fibroids, as reason for referral  D21.9   ? 1 very small fibroid is seen, no follow up is needed for this, recommend continue the POP as prescribed ?  ?  ? ? ? ?Follow Up: ?Return in about 3 months (around  03/29/2022) for Follow up, with Dr Elonda Husky. ? ? ? ? ? ? ? All questions were answered. ? ?Past Medical History:  ?Diagnosis Date  ? Anxiety   ? Depression   ? Diabetes ([place])   ? Eczema   ? ? ?Past Surgical History:  ?Procedure Laterality Date  ? HERNIA REPAIR    ? ? ?OB History   ? ? Gravida  ?0  ? Para  ?0  ? Term  ?0  ? Preterm  ?0  ? AB  ?0  ? Living  ?0  ?  ? ? SAB  ?0  ? IAB  ?0  ? Ectopic  ?0  ? Multiple  ?0  ? Live Births  ?0  ?   ?  ?  ? ? ?Allergies  ?Allergen Reactions  ? Orange Oil Itching  ? ? ?Social History  ? ?Socioeconomic History  ? Marital status: Single  ?  Spouse name: Not on file  ? Number of children: Not on file  ? Years of education: Not on file  ? Highest education level: Not on file  ?Occupational History  ? Not on file  ?Tobacco Use  ? Smoking status: Never  ? Smokeless tobacco: Never  ?Vaping Use  ? Vaping Use: Never used  ?Substance and Sexual Activity  ? Alcohol use: Yes  ? Drug use: Never  ? Sexual activity: Not Currently  ?  Birth control/protection: Pill  ?Other Topics Concern  ? Not on file  ?Social History Narrative  ? Not on file  ? ?Social Determinants of Health  ? ?Financial Resource Strain: Low Risk   ? Difficulty of Paying Living Expenses: Not very hard  ?Food Insecurity: No Food Insecurity  ? Worried  About Charity fundraiser in the Last Year: Never true  ? Ran Out of Food in the Last Year: Never true  ?Transportation Needs: No Transportation Needs  ? Lack of Transportation (Medical): No  ? Lack of Transportation (Non-Medical): No  ?Physical Activity: Insufficiently Active  ? Days of Exercise per Week: 3 days  ? Minutes of Exercise per Session: 20 min  ?Stress: Stress Concern Present  ? Feeling of Stress : To some extent  ?Social Connections: Moderately Integrated  ? Frequency of Communication with Friends and Family: More than three times a week  ? Frequency of Social Gatherings with Friends and Family: More than three times a week  ? Attends Religious Services: More than 4 times per year  ? Active Member of Clubs or Organizations: Yes  ? Attends Archivist Meetings: More than 4 times per year  ? Marital Status: Never married  ? ? ?History reviewed. No pertinent family history. ? ?

## 2022-01-02 ENCOUNTER — Ambulatory Visit (HOSPITAL_COMMUNITY): Payer: 59

## 2022-01-02 ENCOUNTER — Encounter (HOSPITAL_COMMUNITY): Payer: Self-pay

## 2022-01-09 ENCOUNTER — Encounter: Payer: Self-pay | Admitting: Obstetrics & Gynecology

## 2022-04-01 ENCOUNTER — Encounter: Payer: Self-pay | Admitting: Obstetrics and Gynecology

## 2022-04-01 ENCOUNTER — Ambulatory Visit (INDEPENDENT_AMBULATORY_CARE_PROVIDER_SITE_OTHER): Payer: 59 | Admitting: Obstetrics and Gynecology

## 2022-04-01 DIAGNOSIS — Z3041 Encounter for surveillance of contraceptive pills: Secondary | ICD-10-CM | POA: Diagnosis not present

## 2022-04-01 DIAGNOSIS — Z309 Encounter for contraceptive management, unspecified: Secondary | ICD-10-CM | POA: Insufficient documentation

## 2022-04-01 DIAGNOSIS — D259 Leiomyoma of uterus, unspecified: Secondary | ICD-10-CM | POA: Diagnosis not present

## 2022-04-01 DIAGNOSIS — D219 Benign neoplasm of connective and other soft tissue, unspecified: Secondary | ICD-10-CM

## 2022-04-01 NOTE — Patient Instructions (Signed)

## 2022-04-01 NOTE — Progress Notes (Signed)
Julie Becker is here for f/u of her uterine fibroid. See Dr Brynda Greathouse office note. Pt has no complaints today. Doing well with OCP's as prescribed by her PCP Working on wt loss as well.  PE AF VSS Lungs clear Heart RRR Abd soft + BS  A/P Uterine fibroid        Contraception management  Doing well. Continue with OCP's as per PCP. F/U with PRN

## 2022-04-02 ENCOUNTER — Emergency Department (HOSPITAL_COMMUNITY): Payer: 59

## 2022-04-02 ENCOUNTER — Encounter (HOSPITAL_COMMUNITY): Payer: Self-pay

## 2022-04-02 ENCOUNTER — Other Ambulatory Visit: Payer: Self-pay

## 2022-04-02 ENCOUNTER — Emergency Department (HOSPITAL_COMMUNITY)
Admission: EM | Admit: 2022-04-02 | Discharge: 2022-04-02 | Disposition: A | Discharge status: Home / Self Care | Payer: 59 | Attending: Emergency Medicine | Admitting: Emergency Medicine

## 2022-04-02 DIAGNOSIS — S39012A Strain of muscle, fascia and tendon of lower back, initial encounter: Secondary | ICD-10-CM | POA: Diagnosis not present

## 2022-04-02 DIAGNOSIS — S199XXA Unspecified injury of neck, initial encounter: Secondary | ICD-10-CM | POA: Diagnosis present

## 2022-04-02 DIAGNOSIS — Y9389 Activity, other specified: Secondary | ICD-10-CM | POA: Diagnosis not present

## 2022-04-02 DIAGNOSIS — Y92410 Unspecified street and highway as the place of occurrence of the external cause: Secondary | ICD-10-CM | POA: Diagnosis not present

## 2022-04-02 DIAGNOSIS — M542 Cervicalgia: Secondary | ICD-10-CM | POA: Diagnosis not present

## 2022-04-02 LAB — PREGNANCY, URINE: Preg Test, Ur: NEGATIVE

## 2022-04-02 MED ORDER — IBUPROFEN 800 MG PO TABS
800.0000 mg | ORAL_TABLET | Freq: Once | ORAL | Status: AC
Start: 2022-04-02 — End: 2022-04-02
  Administered 2022-04-02: 800 mg via ORAL
  Filled 2022-04-02: qty 1

## 2022-04-02 MED ORDER — ACETAMINOPHEN 325 MG PO TABS
650.0000 mg | ORAL_TABLET | Freq: Once | ORAL | Status: AC
  Administered 2022-04-02: 650 mg via ORAL
  Filled 2022-04-02: qty 2

## 2022-04-02 MED ORDER — IBUPROFEN 600 MG PO TABS: 600.0000 mg | ORAL_TABLET | Freq: Four times a day (QID) | ORAL | 0 refills | Status: DC | PRN

## 2022-04-02 MED ORDER — METHOCARBAMOL 500 MG PO TABS
500.0000 mg | ORAL_TABLET | Freq: Three times a day (TID) | ORAL | 0 refills | Status: DC | PRN
Start: 2022-04-02 — End: 2023-06-28

## 2022-04-02 NOTE — ED Notes (Signed)
I provided reinforced discharge education based off of discharge instructions. Pt acknowledged and understood my education. Pt had no further questions/concerns for provider/myself.  °
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
## 2022-04-02 NOTE — ED Notes (Signed)
Pt ambulatory without assistance.  

## 2022-04-02 NOTE — Discharge Instructions (Signed)
Your testing is negative for any fractures.  Your x-ray and CT scan does show you have some degenerative changes about your back but no acute injuries tonight.  Take the pain medication and nausea medication as prescribed.  Follow-up with your doctor.  Return to the ED with increasing pain, weakness, numbness, tingling, bowel or bladder incontinence, fever, vomiting, anyother concerns

## 2022-04-10 ENCOUNTER — Other Ambulatory Visit (HOSPITAL_COMMUNITY)
Admission: RE | Admit: 2022-04-10 | Discharge: 2022-04-10 | Disposition: A | Discharge status: Home / Self Care | Payer: 59 | Source: Ambulatory Visit | Attending: Obstetrics & Gynecology | Admitting: Obstetrics & Gynecology

## 2022-04-10 ENCOUNTER — Other Ambulatory Visit (INDEPENDENT_AMBULATORY_CARE_PROVIDER_SITE_OTHER): Payer: 59

## 2022-04-10 DIAGNOSIS — N898 Other specified noninflammatory disorders of vagina: Secondary | ICD-10-CM | POA: Insufficient documentation

## 2022-04-10 NOTE — Progress Notes (Signed)
   NURSE VISIT- VAGINITIS/STD  SUBJECTIVE:  Julie Becker is a 21 y.o. G0P0000 GYN patientfemale here for a vaginal swab for vaginitis screening, STD screen.  She reports the following symptoms: discharge described as white and vulvar itching for 2 days. Denies abnormal vaginal bleeding, significant pelvic pain, fever, or UTI symptoms.  OBJECTIVE:  There were no vitals taken for this visit.  Appears well, in no apparent distress  ASSESSMENT: Vaginal swab for vaginitis screening/STD screen  PLAN: Self-collected vaginal probe for Gonorrhea, Chlamydia, Trichomonas, Bacterial Vaginosis, Yeast sent to lab Treatment: to be determined once results are received Follow-up as needed if symptoms persist/worsen, or new symptoms develop  Julie Becker  04/10/2022 3:23 PM

## 2022-04-12 ENCOUNTER — Encounter: Payer: Self-pay | Admitting: Obstetrics & Gynecology

## 2022-04-12 LAB — CERVICOVAGINAL ANCILLARY ONLY
Bacterial Vaginitis (gardnerella): NEGATIVE
Candida Glabrata: NEGATIVE
Candida Vaginitis: POSITIVE — AB
Chlamydia: NEGATIVE
Comment: NEGATIVE
Comment: NEGATIVE
Comment: NEGATIVE
Comment: NEGATIVE
Comment: NEGATIVE
Comment: NORMAL
Neisseria Gonorrhea: NEGATIVE
Trichomonas: NEGATIVE

## 2022-04-15 ENCOUNTER — Other Ambulatory Visit: Payer: Self-pay | Admitting: Women's Health

## 2022-04-15 MED ORDER — FLUCONAZOLE 150 MG PO TABS
150.0000 mg | ORAL_TABLET | Freq: Once | ORAL | 0 refills | Status: AC
Start: 2022-04-15 — End: 2022-04-15

## 2022-04-17 ENCOUNTER — Other Ambulatory Visit: Payer: Self-pay | Admitting: Adult Health

## 2022-04-17 MED ORDER — FLUCONAZOLE 150 MG PO TABS: ORAL_TABLET | ORAL | 1 refills | Status: DC

## 2022-04-17 NOTE — Progress Notes (Signed)
+  yeast on vaginal swab will rx diflucan  

## 2022-05-13 ENCOUNTER — Ambulatory Visit (INDEPENDENT_AMBULATORY_CARE_PROVIDER_SITE_OTHER): Payer: 59 | Admitting: Adult Health

## 2022-05-13 ENCOUNTER — Other Ambulatory Visit (HOSPITAL_COMMUNITY)
Admission: RE | Admit: 2022-05-13 | Discharge: 2022-05-13 | Disposition: A | Discharge status: Home / Self Care | Payer: 59 | Source: Ambulatory Visit | Attending: Adult Health | Admitting: Adult Health

## 2022-05-13 ENCOUNTER — Encounter: Payer: Self-pay | Admitting: Adult Health

## 2022-05-13 VITALS — BP 149/91 | HR 89 | Ht 62.0 in | Wt 316.5 lb

## 2022-05-13 DIAGNOSIS — N941 Unspecified dyspareunia: Secondary | ICD-10-CM | POA: Diagnosis not present

## 2022-05-13 DIAGNOSIS — Z113 Encounter for screening for infections with a predominantly sexual mode of transmission: Secondary | ICD-10-CM | POA: Diagnosis not present

## 2022-05-13 DIAGNOSIS — N926 Irregular menstruation, unspecified: Secondary | ICD-10-CM | POA: Insufficient documentation

## 2022-05-13 NOTE — Progress Notes (Signed)
  Subjective:     Patient ID: Julie Becker, female   DOB: August 01, 2001, 21 y.o.   MRN: [id number]  HPI Julie Becker is a 21 year old black female,with SO, G0P0, in complaining of lst period lasting 15 days, started after sex and has pain with penetration. She says periods usually 5-7 days, she is on Amethia. She has lost 52 lbs and A1c is much better, taking Ozempic now. Had pap February 2023 at PCP she says  PCP is [facility]  Review of Systems Last periods 15 days, started after sex, usually 5-7 days Has pain with sex Reviewed past medical,surgical, social and family history. Reviewed medications and allergies.     Objective:   Physical Exam BP (!) 149/91 (BP Location: Right Arm, Patient Position: Sitting, Cuff Size: Normal)   Pulse 89   Ht $R'5\' 2"'dc$  (1.575 m)   Wt (!) 316 lb 8 oz (143.6 kg)   LMP 04/19/2022   BMI 57.89 kg/m     Skin warm and dry.Pelvic: external genitalia is normal in appearance no lesions, vagina: pink, no blood,urethra has no lesions or masses noted, cervix:smooth and nulliparous, no CMT, uterus: normal size, shape and contour, non tender, no masses felt, adnexa: no masses or tenderness noted. Bladder is non tender and no masses felt. CV swab obtained Fall risk is low  Upstream - 05/13/22 1214       Pregnancy Intention Screening   Does the patient want to become pregnant in the next year? Ok Either Way    Does the patient's partner want to become pregnant in the next year? Ok Either Way    Would the patient like to discuss contraceptive options today? No      Contraception Wrap Up   Current Method Oral Contraceptive    End Method Oral Contraceptive            Examination chaperoned by Levy Pupa LPN   Assessment:     1. Irregular bleeding Usually bleeding 5-7 days this past period was 15 days and started after sex Continue COCs  2. Screening examination for STD (sexually transmitted disease) CV swab sent for GC/CHL and trich  3. Dyspareunia,  female Try different positions and get good lubricate and increase foreplay    Plan:    Please send copy of pap  Follow up prn

## 2022-05-14 LAB — CERVICOVAGINAL ANCILLARY ONLY
Chlamydia: NEGATIVE
Comment: NEGATIVE
Comment: NEGATIVE
Comment: NORMAL
Neisseria Gonorrhea: NEGATIVE
Trichomonas: NEGATIVE

## 2022-09-17 ENCOUNTER — Ambulatory Visit: Payer: Medicaid Other | Admitting: Nurse Practitioner

## 2022-09-17 ENCOUNTER — Encounter: Payer: Self-pay | Admitting: Nurse Practitioner

## 2022-09-17 DIAGNOSIS — L03011 Cellulitis of right finger: Secondary | ICD-10-CM

## 2022-09-17 MED ORDER — AMOXICILLIN-POT CLAVULANATE 875-125 MG PO TABS: 1.0000 | ORAL_TABLET | Freq: Two times a day (BID) | ORAL | 0 refills | Status: AC

## 2022-09-17 NOTE — Progress Notes (Signed)
Acute Office Visit  Subjective:     Patient ID: Julie Becker, female    DOB: [birth year], 21 y.o.   MRN: [id number]  Chief Complaint  Patient presents with   finger infection    HPI Patient presents with c/o of finger nail infection on her right middle finger.   Reports she had a hang nail x 1 month ago. Admits to biting this off. A week later she developed swelling and pus,which she states she was able to drain at home. However, infection returned a week ago and has gotten worse. She has done warm water soaks, but admits she has not done this everyday. Reports middle finger is very painful to touch and has increased in size. Denies weakness in finger and reports sensation is still intact.  She denies fever or chills. Patient does have a PCP in [place], [place]  Review of Systems  Constitutional:  Negative for chills and fever.  Neurological:  Negative for weakness.        Objective:    There were no vitals taken for this visit.   Physical Exam Constitutional:      General: She is not in acute distress. Pulmonary:     Effort: Pulmonary effort is normal.  Musculoskeletal:        General: Normal range of motion.     Right hand: Swelling and tenderness present.     Left hand: Normal.     Comments: Swelling and tenderness of right middle finger. No drainage to site but noted to have pus collection under skin beside fingernail.   Neurological:     General: No focal deficit present.     Mental Status: She is alert and oriented to person, place, and time.     No results found for any visits on 09/17/22.     Assessment & Plan:   Problem List Items Addressed This Visit   None Visit Diagnoses     Paronychia of right middle finger    -  Primary   Relevant Medications   amoxicillin-clavulanate (AUGMENTIN) 875-125 MG tablet     Will start Augmentin for treatment of paronychia as well as warm water soaks 4 times a day. Recommended patient follow-up with PCP in 2-3 days for  possible I and D.  Discussed keeping hands clean and using antibacterial soap when possible.   Meds ordered this encounter  Medications   amoxicillin-clavulanate (AUGMENTIN) 875-125 MG tablet    Sig: Take 1 tablet by mouth 2 (two) times daily for 7 days.    Dispense:  14 tablet    Refill:  0    As needed.   Lurena Joiner, NP

## 2022-09-18 ENCOUNTER — Other Ambulatory Visit: Payer: Self-pay

## 2022-09-18 ENCOUNTER — Emergency Department (HOSPITAL_COMMUNITY)
Admission: EM | Admit: 2022-09-18 | Discharge: 2022-09-18 | Disposition: A | Discharge status: Home / Self Care | Payer: Medicaid Other | Attending: Emergency Medicine | Admitting: Emergency Medicine

## 2022-09-18 ENCOUNTER — Encounter (HOSPITAL_COMMUNITY): Payer: Self-pay

## 2022-09-18 DIAGNOSIS — L03011 Cellulitis of right finger: Secondary | ICD-10-CM | POA: Insufficient documentation

## 2022-09-18 DIAGNOSIS — E119 Type 2 diabetes mellitus without complications: Secondary | ICD-10-CM | POA: Insufficient documentation

## 2022-09-18 NOTE — ED Triage Notes (Addendum)
Patient said she bit her finger nail and then pulled the hang nail out of her right middle finger. Then she thinks it got infected because she saw pus on the finger. Then she noticed her finger got swollen Friday. Painful. Went to her doctor yesterday who prescribed amoxicillin. She said she is taking it now.

## 2022-09-18 NOTE — ED Provider Notes (Signed)
[facility] DEPT Provider Note   CSN: [id number] Arrival date & time: 09/18/22  1417     History  Chief Complaint  Patient presents with   Finger Injury    Julie Becker is a 21 y.o. female.  21 y.o female with no PMH presents to the ED with chief complaint of right hand paronychia x a couple of days.  Patient reports pain along the right index finger, has been ongoing over the last several days, has taken Tylenol for pain control without much improvement in symptoms.  She does report a prior history of diabetes over "they state I am diabetic ".  Denies any other injury or complaints.  The history is provided by the patient and medical records.       Home Medications Prior to Admission medications   Medication Sig Start Date End Date Taking? Authorizing Provider  amoxicillin-clavulanate (AUGMENTIN) 875-125 MG tablet Take 1 tablet by mouth 2 (two) times daily for 7 days. 09/17/22 09/24/22  Lurena Joiner, NP  ferrous sulfate 325 (65 FE) MG tablet Take 1 tablet by mouth 2 (two) times daily.    [provider]  fluticasone (FLONASE) 50 MCG/ACT nasal spray 1 spray by Each Nare route daily. 04/08/18   [provider]  ibuprofen (ADVIL) 600 MG tablet Take 1 tablet (600 mg total) by mouth every 6 (six) hours as needed. 04/02/22   Rancour, Annie Main, MD  Levonorgestrel-Ethinyl Estradiol (AMETHIA) 0.15-0.03 &0.01 MG tablet 1 tablet 11/27/21   [provider]  levothyroxine (SYNTHROID) 150 MCG tablet Take 150 mcg by mouth every morning. 02/27/22   [provider]  metFORMIN (GLUCOPHAGE) 500 MG tablet TAKE 1 TABLET BY MOUTH EVERY DAY WITH A MEAL FOR 30 DAYS Patient not taking: Reported on 09/17/2022    [provider]  methocarbamol (ROBAXIN) 500 MG tablet Take 1 tablet (500 mg total) by mouth every 8 (eight) hours as needed for muscle spasms. 04/02/22   Rancour, Annie Main, MD  Semaglutide,0.25 or 0.'5MG'$ /DOS, (OZEMPIC,  0.25 OR 0.5 MG/DOSE,) 2 MG/1.5ML SOPN as directed: 0.'25mg'$  11/27/21   [provider]  sertraline (ZOLOFT) 50 MG tablet TAKE 1 TABLET BY MOUTH EVERY DAY FOR 30 DAYS    [provider]  triamcinolone (KENALOG) 4.196 % cream 1 application Patient not taking: Reported on 09/17/2022 05/02/21   [provider]  Vitamin D, Ergocalciferol, (DRISDOL) 1.25 MG (50000 UNIT) CAPS capsule Take 50,000 Units by mouth once a week. 12/02/21   [provider]      Allergies    Orange oil    Review of Systems   Review of Systems  Constitutional:  Negative for fever.  Skin:  Positive for wound.    Physical Exam Updated Vital Signs BP (!) 152/84 (BP Location: Left Arm)   Pulse 96   Temp 98.5 F (36.9 C) (Oral)   Resp 16   SpO2 99%  Physical Exam Vitals and nursing note reviewed.  Constitutional:      Appearance: Normal appearance.  HENT:     Head: Normocephalic and atraumatic.  Eyes:     Pupils: Pupils are equal, round, and reactive to light.  Cardiovascular:     Rate and Rhythm: Normal rate.  Pulmonary:     Effort: Pulmonary effort is normal.  Abdominal:     General: Abdomen is flat.  Musculoskeletal:     Cervical back: Normal range of motion and neck supple.  Skin:    General: Skin is warm and  dry.     Comments: Right index finger paronychia with significant pus noted.  Redness to the area.  Neurological:     Mental Status: She is alert and oriented to person, place, and time.     ED Results / Procedures / Treatments   Labs (all labs ordered are listed, but only abnormal results are displayed) Labs Reviewed - No data to display  EKG None  Radiology No results found.  Procedures .Marland KitchenIncision and Drainage  Date/Time: 09/18/2022 2:49 PM  Performed by: Janeece Fitting, PA-C Authorized by: Janeece Fitting, PA-C   Consent:    Consent obtained:  Verbal   Consent given by:  Patient   Risks discussed:  Incomplete drainage   Alternatives discussed:  No  treatment Universal protocol:    Patient identity confirmed:  Verbally with patient Location:    Type:  Fluid collection   Size:  1   Location:  Upper extremity   Upper extremity location:  Finger   Finger location:  R index finger Pre-procedure details:    Skin preparation:  Antiseptic wash Sedation:    Sedation type:  None Anesthesia:    Anesthesia method:  None Procedure type:    Complexity:  Simple Procedure details:    Incision types:  Stab incision   Wound management:  Probed and deloculated   Drainage:  Serosanguinous   Drainage amount:  Moderate   Wound treatment:  Wound left open   Packing materials:  None Post-procedure details:    Procedure completion:  Tolerated well, no immediate complications     Medications Ordered in ED Medications - No data to display  ED Course/ Medical Decision Making/ A&P                           Medical Decision Making   Patient presents to the ED with right index paronychia that is been ongoing over the last several days.  She has not been taking anything for her symptoms, denies any prior history of diabetes although she states "they told me that I might have it ".  She is neurovascularly intact, I discussed I&D while in triage with patient, she is agreeable of this and we proceeded with an I&D with extensive amount of copious serosanguineous fluid removed from her right index finger.  She is requesting a note for work as she is currently employed as a [employer]ar[employer] manager, strict return precautions discussed at length, patient hemodynamically stable for discharge.   Portions of this note were generated with Lobbyist. Dictation errors may occur despite best attempts at proofreading.   Final Clinical Impression(s) / ED Diagnoses Final diagnoses:  Paronychia of right index finger    Rx / DC Orders ED Discharge Orders     None         Janeece Fitting, PA-C 09/18/22 F[place]ile, Julie, MD 09/21/22 [phone]

## 2022-09-18 NOTE — Discharge Instructions (Signed)
You had a paronychia drained from your right index finger, you will need this clean and dry for the remaining days.  If you develop a fever, or worsening pain you will need to return to the Emergency department.

## 2022-09-18 NOTE — ED Notes (Signed)
ED Provider at bedside. 

## 2022-09-23 ENCOUNTER — Other Ambulatory Visit: Payer: Self-pay | Admitting: Adult Health

## 2022-09-23 ENCOUNTER — Other Ambulatory Visit (HOSPITAL_COMMUNITY)
Admission: RE | Admit: 2022-09-23 | Discharge: 2022-09-23 | Disposition: A | Discharge status: Home / Self Care | Payer: Medicaid Other | Source: Ambulatory Visit | Attending: Obstetrics & Gynecology | Admitting: Obstetrics & Gynecology

## 2022-09-23 ENCOUNTER — Other Ambulatory Visit (INDEPENDENT_AMBULATORY_CARE_PROVIDER_SITE_OTHER): Payer: Self-pay | Admitting: *Deleted

## 2022-09-23 DIAGNOSIS — N898 Other specified noninflammatory disorders of vagina: Secondary | ICD-10-CM | POA: Diagnosis not present

## 2022-09-23 MED ORDER — FLUCONAZOLE 150 MG PO TABS: ORAL_TABLET | ORAL | 1 refills | Status: DC

## 2022-09-23 NOTE — Progress Notes (Signed)
Rx diflucan.  

## 2022-09-23 NOTE — Progress Notes (Signed)
   NURSE VISIT- VAGINITIS/STD/POC  SUBJECTIVE:  Julie Becker is a 21 y.o. G0P0000 GYN patientfemale here for a vaginal swab for vaginitis screening, STD screen.  She reports the following symptoms: vulvar itching for several days days. Denies abnormal vaginal bleeding, significant pelvic pain, fever, or UTI symptoms.  OBJECTIVE:  There were no vitals taken for this visit.  Appears well, in no apparent distress  ASSESSMENT: Vaginal swab for vaginitis screening  PLAN: Self-collected vaginal probe for Gonorrhea, Chlamydia, Trichomonas, Bacterial Vaginosis, Yeast sent to lab Treatment: to be determined once results are received Follow-up as needed if symptoms persist/worsen, or new symptoms develop  Pt finishing antibiotics, Anderson Malta to send in Diflucan to [place] [facility].   [name]  09/23/2022 3:59 PM

## 2022-09-25 ENCOUNTER — Other Ambulatory Visit: Payer: Self-pay | Admitting: Adult Health

## 2022-09-25 LAB — CERVICOVAGINAL ANCILLARY ONLY
Bacterial Vaginitis (gardnerella): POSITIVE — AB
Candida Glabrata: NEGATIVE
Candida Vaginitis: POSITIVE — AB
Chlamydia: NEGATIVE
Comment: NEGATIVE
Comment: NEGATIVE
Comment: NEGATIVE
Comment: NEGATIVE
Comment: NEGATIVE
Comment: NORMAL
Neisseria Gonorrhea: NEGATIVE
Trichomonas: NEGATIVE

## 2022-09-25 MED ORDER — METRONIDAZOLE 500 MG PO TABS: 500.0000 mg | ORAL_TABLET | Freq: Two times a day (BID) | ORAL | 0 refills | Status: DC

## 2022-10-24 ENCOUNTER — Ambulatory Visit: Payer: Medicaid Other | Admitting: Adult Health

## 2022-10-29 ENCOUNTER — Ambulatory Visit (INDEPENDENT_AMBULATORY_CARE_PROVIDER_SITE_OTHER): Payer: Self-pay | Admitting: Adult Health

## 2022-10-29 ENCOUNTER — Encounter: Payer: Self-pay | Admitting: Adult Health

## 2022-10-29 VITALS — BP 147/99 | HR 73 | Ht 62.0 in | Wt 329.4 lb

## 2022-10-29 DIAGNOSIS — N926 Irregular menstruation, unspecified: Secondary | ICD-10-CM

## 2022-10-29 NOTE — Progress Notes (Signed)
  Subjective:     Patient ID: Julie Becker, female   DOB: 2001/03/20, 22 y.o.   MRN: [id number]  HPI Julie Becker is a 22 year old black female,with SO, G0P0, in complaining of irregular bleeding, in December had multiple episodes of bleeding. She stopped her birth control pills in September. She saw PCP last week and started back on pills 10/23/22.   Last pap was negative per pt 12/04/21.  PCP is [facility] in [place].  Review of Systems Had irregular bleeding, but it has stopped Has restarted OCs   Reviewed past medical,surgical, social and family history. Reviewed medications and allergies.  Objective:   Physical Exam BP (!) 147/99 (BP Location: Right Arm, Patient Position: Sitting, Cuff Size: Large)   Pulse 73   Ht '5\' 2"'$  (1.575 m)   Wt (!) 329 lb 6.4 oz (149.4 kg)   BMI 60.25 kg/m     Skin warm and dry.Pelvic: external genitalia is normal in appearance no lesions, vagina: tan discharge without odor,urethra has no lesions or masses noted, cervix:smooth, uterus: normal size, shape and contour, non tender, no masses felt, adnexa: no masses or tenderness noted. Bladder is non tender and no masses felt.  Upstream - 10/29/22 1157       Pregnancy Intention Screening   Does the patient want to become pregnant in the next year? No    Does the patient's partner want to become pregnant in the next year? No    Would the patient like to discuss contraceptive options today? No      Contraception Wrap Up   Current Method Oral Contraceptive    End Method Oral Contraceptive    Contraception Counseling Provided No            Examination chaperoned by Celene Squibb LPN  Assessment:     1. Irregular bleeding Has resolved Take birth control daily Has follow up with PCP 11/21/22    Plan:    Request copy of pap Follow up prn

## 2022-11-20 ENCOUNTER — Ambulatory Visit: Payer: Medicaid Other | Admitting: Orthopaedic Surgery

## 2022-12-12 ENCOUNTER — Encounter: Payer: Self-pay | Admitting: Radiology

## 2023-01-07 ENCOUNTER — Other Ambulatory Visit: Payer: Self-pay | Admitting: Adult Health

## 2023-01-07 MED ORDER — FLUCONAZOLE 150 MG PO TABS
ORAL_TABLET | ORAL | 1 refills | Status: DC
Start: 2023-01-07 — End: 2023-09-08

## 2023-01-07 NOTE — Progress Notes (Signed)
Rx diflucan.  

## 2023-01-09 ENCOUNTER — Other Ambulatory Visit (HOSPITAL_COMMUNITY)
Admission: RE | Admit: 2023-01-09 | Discharge: 2023-01-09 | Disposition: A | Discharge status: Home / Self Care | Payer: PRIVATE HEALTH INSURANCE | Source: Ambulatory Visit | Attending: Obstetrics & Gynecology | Admitting: Obstetrics & Gynecology

## 2023-01-09 ENCOUNTER — Other Ambulatory Visit (INDEPENDENT_AMBULATORY_CARE_PROVIDER_SITE_OTHER): Payer: PRIVATE HEALTH INSURANCE | Admitting: *Deleted

## 2023-01-09 DIAGNOSIS — N898 Other specified noninflammatory disorders of vagina: Secondary | ICD-10-CM | POA: Diagnosis present

## 2023-01-09 NOTE — Progress Notes (Signed)
   NURSE VISIT- VAGINITIS/STD/POC  SUBJECTIVE:  Julie Becker is a 22 y.o. G0P0000 GYN patientfemale here for a vaginal swab for vaginitis screening, STD screen.  She reports the following symptoms: vulvar itching for several  days. Denies abnormal vaginal bleeding, significant pelvic pain, fever, or UTI symptoms.  OBJECTIVE:  There were no vitals taken for this visit.  Appears well, in no apparent distress  ASSESSMENT: Vaginal swab for vaginitis screening  PLAN: Self-collected vaginal probe for Gonorrhea, Chlamydia, Trichomonas, Bacterial Vaginosis, Yeast sent to lab Treatment: to be determined once results are received Follow-up as needed if symptoms persist/worsen, or new symptoms develop  [name]  01/09/2023 2:23 PM

## 2023-01-13 ENCOUNTER — Other Ambulatory Visit: Payer: Self-pay | Admitting: Adult Health

## 2023-01-13 LAB — CERVICOVAGINAL ANCILLARY ONLY
Bacterial Vaginitis (gardnerella): NEGATIVE
Candida Glabrata: NEGATIVE
Candida Vaginitis: POSITIVE — AB
Chlamydia: NEGATIVE
Comment: NEGATIVE
Comment: NEGATIVE
Comment: NEGATIVE
Comment: NEGATIVE
Comment: NEGATIVE
Comment: NORMAL
Neisseria Gonorrhea: NEGATIVE
Trichomonas: NEGATIVE

## 2023-03-03 ENCOUNTER — Encounter: Payer: Self-pay | Admitting: Neurology

## 2023-06-02 NOTE — Progress Notes (Deleted)
NEUROLOGY CONSULTATION NOTE  Julie Becker MRN: [id number] DOB: 12-03-2000  Referring provider: Coral Ceo, FNP Primary care provider: [facility]  Reason for consult:  migrianes  Assessment/Plan:   ***   Subjective:  Julie Becker is a 22 year old female with DM 2, Hashimoto's disease and depression/anxiety who presents for migraines.  History supplemented by referring provider's note.  Onset:  *** Location:  *** Quality:  *** Intensity:  ***.  *** denies new headache, thunderclap headache or severe headache that wakes *** from sleep. Aura:  *** Prodrome:  *** Postdrome:  *** Associated symptoms:  ***.  *** denies associated unilateral numbness or weakness. Duration:  *** Frequency:  *** Frequency of abortive medication: *** Triggers:  *** Relieving factors:  *** Activity:  ***  Past NSAIDS/analgesics:  naproxen Past abortive triptans:  *** Past abortive ergotamine:  none Past muscle relaxants:  Robaxin Past anti-emetic:  *** Past antihypertensive medications:  *** Past antidepressant medications:  *** Past anticonvulsant medications:  *** Past anti-CGRP:  none Past vitamins/Herbal/Supplements:  none Past antihistamines/decongestants:  Flonase Other past therapies:  ***  Current NSAIDS/analgesics:  ibuprofen Current triptans:  none Current ergotamine:  none Current anti-emetic:  none Current muscle relaxants:  none Current Antihypertensive medications:  none Current Antidepressant medications:  sertraline 50mg  daily Current Anticonvulsant medications:  none Current anti-CGRP:  none Current Vitamins/Herbal/Supplements:  none Current Antihistamines/Decongestants:  none Other therapy:  none Birth control:  Lovonorgestrel-Ethinyl Estradiol Other medications:  levothyroxine    Caffeine:  *** Alcohol:  *** Smoker:  *** Diet:  *** Exercise:  *** Depression:  ***; Anxiety:  *** Other pain:  *** Sleep hygiene:  *** Family history  of headache:  ***      PAST MEDICAL HISTORY: Past Medical History:  Diagnosis Date   Anxiety    Depression    Diabetes (HCC)    Eczema    Hashimoto's disease     PAST SURGICAL HISTORY: Past Surgical History:  Procedure Laterality Date   HERNIA REPAIR      MEDICATIONS: Current Outpatient Medications on File Prior to Visit  Medication Sig Dispense Refill   metroNIDAZOLE (FLAGYL) 500 MG tablet Take 1 tablet (500 mg total) by mouth 2 (two) times daily. (Patient not taking: Reported on 10/29/2022) 14 tablet 0   Dulaglutide (TRULICITY [place]) Inject into the skin.     ferrous sulfate 325 (65 FE) MG tablet Take 1 tablet by mouth 2 (two) times daily.     fluconazole (DIFLUCAN) 150 MG tablet Take 1 now and 1 in 3 days 2 tablet 1   fluticasone (FLONASE) 50 MCG/ACT nasal spray 1 spray by Each Nare route daily. (Patient not taking: Reported on 10/29/2022)     ibuprofen (ADVIL) 600 MG tablet Take 1 tablet (600 mg total) by mouth every 6 (six) hours as needed. 30 tablet 0   Levonorgestrel-Ethinyl Estradiol (AMETHIA) 0.15-0.03 &0.01 MG tablet 1 tablet     levothyroxine (SYNTHROID) 150 MCG tablet Take 150 mcg by mouth every morning.     metFORMIN (GLUCOPHAGE) 500 MG tablet      methocarbamol (ROBAXIN) 500 MG tablet Take 1 tablet (500 mg total) by mouth every 8 (eight) hours as needed for muscle spasms. (Patient not taking: Reported on 10/29/2022) 20 tablet 0   Semaglutide,0.25 or 0.5MG /DOS, (OZEMPIC, 0.25 OR 0.5 MG/DOSE,) 2 MG/1.5ML SOPN as directed: 0.25mg  (Patient not taking: Reported on 10/29/2022)     sertraline (ZOLOFT) 50 MG tablet TAKE 1 TABLET BY MOUTH EVERY  DAY FOR 30 DAYS     triamcinolone (KENALOG) 0.025 % cream 1 application (Patient not taking: Reported on 09/17/2022)     Vitamin D, Ergocalciferol, (DRISDOL) 1.25 MG (50000 UNIT) CAPS capsule Take 50,000 Units by mouth once a week.     No current facility-administered medications on file prior to visit.    ALLERGIES: Allergies   Allergen Reactions   Orange Oil Itching    FAMILY HISTORY: Family History  Problem Relation Age of Onset   Diabetes Paternal Grandmother    Hypertension Maternal Grandmother    Diabetes Maternal Grandmother    Heart disease Maternal Grandmother    Diabetes Maternal Grandfather    Dementia Maternal Grandfather    Diabetes Father    Other Mother        thyroid removed   Anxiety disorder Sister    Depression Sister     Objective:  *** General: No acute distress.  Patient appears well-groomed.   Head:  Normocephalic/atraumatic Eyes:  fundi examined but not visualized Neck: supple, no paraspinal tenderness, full range of motion Back: No paraspinal tenderness Heart: regular rate and rhythm Lungs: Clear to auscultation bilaterally. Vascular: No carotid bruits. Neurological Exam: Mental status: alert and oriented to person, place, and time, speech fluent and not dysarthric, language intact. Cranial nerves: CN I: not tested CN II: pupils equal, round and reactive to light, visual fields intact CN III, IV, VI:  full range of motion, no nystagmus, no ptosis CN V: facial sensation intact. CN VII: upper and lower face symmetric CN VIII: hearing intact CN IX, X: gag intact, uvula midline CN XI: sternocleidomastoid and trapezius muscles intact CN XII: tongue midline Bulk & Tone: normal, no fasciculations. Motor:  muscle strength 5/5 throughout Sensation:  Pinprick, temperature and vibratory sensation intact. Deep Tendon Reflexes:  2+ throughout,  toes downgoing.   Finger to nose testing:  Without dysmetria.   Heel to shin:  Without dysmetria.   Gait:  Normal station and stride.  Romberg negative.    Thank you for allowing me to take part in the care of this patient.  Shon Millet, DO  CC: ***

## 2023-06-03 ENCOUNTER — Encounter: Payer: Self-pay | Admitting: Neurology

## 2023-06-03 ENCOUNTER — Ambulatory Visit: Payer: PRIVATE HEALTH INSURANCE | Admitting: Neurology

## 2023-06-03 DIAGNOSIS — Z029 Encounter for administrative examinations, unspecified: Secondary | ICD-10-CM

## 2023-06-28 ENCOUNTER — Emergency Department (HOSPITAL_BASED_OUTPATIENT_CLINIC_OR_DEPARTMENT_OTHER)
Admission: EM | Admit: 2023-06-28 | Discharge: 2023-06-28 | Disposition: A | Discharge status: Home / Self Care | Payer: PRIVATE HEALTH INSURANCE | Attending: Emergency Medicine | Admitting: Emergency Medicine

## 2023-06-28 ENCOUNTER — Other Ambulatory Visit: Payer: Self-pay

## 2023-06-28 ENCOUNTER — Encounter (HOSPITAL_BASED_OUTPATIENT_CLINIC_OR_DEPARTMENT_OTHER): Payer: Self-pay

## 2023-06-28 DIAGNOSIS — H00021 Hordeolum internum right upper eyelid: Secondary | ICD-10-CM | POA: Diagnosis not present

## 2023-06-28 DIAGNOSIS — E119 Type 2 diabetes mellitus without complications: Secondary | ICD-10-CM | POA: Insufficient documentation

## 2023-06-28 DIAGNOSIS — Z7984 Long term (current) use of oral hypoglycemic drugs: Secondary | ICD-10-CM | POA: Insufficient documentation

## 2023-06-28 DIAGNOSIS — H5711 Ocular pain, right eye: Secondary | ICD-10-CM | POA: Diagnosis present

## 2023-06-28 MED ORDER — IBUPROFEN 600 MG PO TABS: 600.0000 mg | ORAL_TABLET | Freq: Four times a day (QID) | ORAL | 0 refills | Status: DC | PRN

## 2023-06-28 MED ORDER — ERYTHROMYCIN 5 MG/GM OP OINT
TOPICAL_OINTMENT | Freq: Once | OPHTHALMIC | Status: AC
  Filled 2023-06-28: qty 3.5

## 2023-06-28 NOTE — ED Notes (Signed)
Patient verbalizes understanding of discharge instructions. Opportunity for questioning and answers were provided. Patient discharged from ED.  °

## 2023-06-28 NOTE — ED Provider Notes (Signed)
Julie Becker   CSN: [id number] Arrival date & time: 06/28/23  1037     History  Chief Complaint  Patient presents with   Eye Problem    right    Julie Becker is a 22 y.o. female.  Presents to the emergency department for evaluation of right upper eyelid pain and swelling.  Symptoms started about 3 days ago.  Initially she had more pain and swelling.  She called her doctor and they recommended that she try Benadryl.  She has not had any exudates or drainage from the eye.  No vision changes.  She does have a history of diabetes.  She has been applying warm and cool compresses.  She denies foreign bodies or injuries to the area.  No previous ocular surgeries.  No vision change or loss.       Home Medications Prior to Admission medications   Medication Sig Start Date End Date Taking? Authorizing Provider  ibuprofen (ADVIL) 600 MG tablet Take 1 tablet (600 mg total) by mouth every 6 (six) hours as needed. 06/28/23  Yes Renne Crigler, PA-C  Dulaglutide (TRULICITY [place]) Inject into the skin.    [provider]  ferrous sulfate 325 (65 FE) MG tablet Take 1 tablet by mouth 2 (two) times daily.    [provider]  fluconazole (DIFLUCAN) 150 MG tablet Take 1 now and 1 in 3 days 01/07/23   Adline Potter, NP  fluticasone (FLONASE) 50 MCG/ACT nasal spray 1 spray by Each Nare route daily. Patient not taking: Reported on 10/29/2022 04/08/18   [provider]  Levonorgestrel-Ethinyl Estradiol (AMETHIA) 0.15-0.03 &0.01 MG tablet 1 tablet 11/27/21   [provider]  levothyroxine (SYNTHROID) 150 MCG tablet Take 150 mcg by mouth every morning. 02/27/22   [provider]  metFORMIN (GLUCOPHAGE) 500 MG tablet     [provider]  sertraline (ZOLOFT) 50 MG tablet TAKE 1 TABLET BY MOUTH EVERY DAY FOR 30 DAYS    [provider]  Vitamin D, Ergocalciferol, (DRISDOL) 1.25 MG (50000  UNIT) CAPS capsule Take 50,000 Units by mouth once a week. 12/02/21   [provider]      Allergies    Orange oil    Review of Systems   Review of Systems  Physical Exam Updated Vital Signs BP (!) 163/101 (BP Location: Right Arm)   Pulse 94   Temp 99.2 F (37.3 C) (Oral)   Resp 20   Ht 5\' 2"  (1.575 m)   Wt (!) 149.7 kg   SpO2 99%   BMI 60.36 kg/m  Physical Exam Vitals and nursing Becker reviewed.  Constitutional:      Appearance: She is well-developed.  HENT:     Head: Normocephalic and atraumatic.  Eyes:     General: Lids are everted, no foreign bodies appreciated. No visual field deficit.       Right eye: Hordeolum present. No discharge.        Left eye: No discharge or hordeolum.     Extraocular Movements: Extraocular movements intact.     Conjunctiva/sclera: Conjunctivae normal.     Right eye: Right conjunctiva is not injected. No chemosis, exudate or hemorrhage.  Pulmonary:     Effort: No respiratory distress.  Musculoskeletal:     Cervical back: Normal range of motion and neck supple.  Skin:    General: Skin is warm and dry.  Neurological:     Mental Status: She is alert.  ED Results / Procedures / Treatments   Labs (all labs ordered are listed, but only abnormal results are displayed) Labs Reviewed - No data to display  EKG None  Radiology No results found.  Procedures Procedures    Medications Ordered in ED Medications  erythromycin ophthalmic ointment (has no administration in time range)    ED Course/ Medical Decision Making/ A&P    Patient seen and examined. History obtained directly from patient.    Labs/EKG: None ordered  Imaging: None ordered  Medications/Fluids: Ordered: Erythromycin ointment  Most recent vital signs reviewed and are as follows: BP (!) 163/101 (BP Location: Right Arm)   Pulse 94   Temp 99.2 F (37.3 C) (Oral)   Resp 20   Ht 5\' 2"  (1.575 m)   Wt (!) 149.7 kg   SpO2 99%   BMI 60.36 kg/m    Initial impression: Right upper eyelid hordeolum, no signs of periorbital cellulitis, globe is normal  Home treatment plan: Warm compresses  Return instructions discussed with patient: Worsening pain, redness, swelling about the eye, vision changes, or new or worsening symptoms  Follow-up instructions discussed with patient: Follow-up with PCP in 3-5 days if not improving                                Medical Decision Making Risk Prescription drug management.   Patient with hordeolum.  Low concern for foreign body or significant infection.  Globe and cornea appear clear.  Will treat with antibiotic ointment for lubrication, warm compresses, PCP follow-up as needed.  NSAIDs given for pain.        Final Clinical Impression(s) / ED Diagnoses Final diagnoses:  Hordeolum internum of right upper eyelid    Rx / DC Orders ED Discharge Orders          Ordered    ibuprofen (ADVIL) 600 MG tablet  Every 6 hours PRN        06/28/23 1113              Renne Crigler, PA-C 06/28/23 1119    Benjiman Core, MD 06/28/23 1235

## 2023-06-28 NOTE — Discharge Instructions (Signed)
Please read and follow all provided instructions.  Your diagnoses today include:  1. Hordeolum internum of right upper eyelid     Tests performed today include: Vital signs. See below for your results today.   Medications prescribed:  Erythromycin  - antibiotic eye ointment  Use this medication as follows: Apply 1/4" of the antibiotic ointment to affected eye up to 6 times a day while awake for 7 days  Ibuprofen (Motrin, Advil) - anti-inflammatory pain medication Do not exceed 600mg  ibuprofen every 6 hours, take with food  You have been prescribed an anti-inflammatory medication or NSAID. Take with food. Take smallest effective dose for the shortest duration needed for your pain. Stop taking if you experience stomach pain or vomiting.   Take any prescribed medications only as directed.  Home care instructions:  Follow any educational materials contained in this packet. If you wear contact lenses, do not use them until your eye caregiver approves. Follow-up care is necessary to be sure the infection is healing if not completely resolved in 2-3 days. See your caregiver or eye specialist as suggested for followup.   Follow-up instructions: Please follow-up with your primary care doctor in the next 2-3 days for further evaluation of your symptoms.  Return instructions:  Please return to the Emergency Department if you experience worsening symptoms.  Please return immediately if you develop severe pain, pus drainage, new change in vision, or fever. Please return if you have any other emergent concerns.  Additional Information:  Your vital signs today were: BP (!) 163/101 (BP Location: Right Arm)   Pulse 94   Temp 99.2 F (37.3 C) (Oral)   Resp 20   Ht 5\' 2"  (1.575 m)   Wt (!) 149.7 kg   SpO2 99%   BMI 60.36 kg/m  If your blood pressure (BP) was elevated above 135/85 this visit, please have this repeated by your doctor within one month. ---------------

## 2023-06-28 NOTE — ED Triage Notes (Signed)
Patient arrives with complaints worsening right eye swelling and blurry vision x4 days. Patient was instructed to take benadryl by another physician and to come to the ED if the symptoms do not improve.

## 2023-06-28 NOTE — ED Notes (Signed)
Pt has right eye pain, itchiness noted to the site and swelling.

## 2023-08-22 ENCOUNTER — Other Ambulatory Visit: Payer: Self-pay

## 2023-08-22 ENCOUNTER — Encounter (HOSPITAL_BASED_OUTPATIENT_CLINIC_OR_DEPARTMENT_OTHER): Payer: Self-pay

## 2023-08-22 ENCOUNTER — Emergency Department (HOSPITAL_BASED_OUTPATIENT_CLINIC_OR_DEPARTMENT_OTHER)
Admission: EM | Admit: 2023-08-22 | Discharge: 2023-08-22 | Disposition: A | Discharge status: Home / Self Care | Payer: PRIVATE HEALTH INSURANCE

## 2023-08-22 DIAGNOSIS — N739 Female pelvic inflammatory disease, unspecified: Secondary | ICD-10-CM | POA: Insufficient documentation

## 2023-08-22 DIAGNOSIS — E119 Type 2 diabetes mellitus without complications: Secondary | ICD-10-CM | POA: Insufficient documentation

## 2023-08-22 DIAGNOSIS — N730 Acute parametritis and pelvic cellulitis: Secondary | ICD-10-CM

## 2023-08-22 DIAGNOSIS — Z7984 Long term (current) use of oral hypoglycemic drugs: Secondary | ICD-10-CM | POA: Diagnosis not present

## 2023-08-22 DIAGNOSIS — R1030 Lower abdominal pain, unspecified: Secondary | ICD-10-CM | POA: Diagnosis present

## 2023-08-22 LAB — URINALYSIS, ROUTINE W REFLEX MICROSCOPIC
Bacteria, UA: NONE SEEN
Bilirubin Urine: NEGATIVE
Glucose, UA: NEGATIVE mg/dL
Ketones, ur: NEGATIVE mg/dL
Leukocytes,Ua: NEGATIVE
Nitrite: NEGATIVE
Protein, ur: NEGATIVE mg/dL
Specific Gravity, Urine: 1.019 (ref 1.005–1.030)
pH: 6.5 (ref 5.0–8.0)

## 2023-08-22 LAB — WET PREP, GENITAL
Clue Cells Wet Prep HPF POC: NONE SEEN
Sperm: NONE SEEN
Trich, Wet Prep: NONE SEEN
WBC, Wet Prep HPF POC: <10 (ref <10)
Yeast Wet Prep HPF POC: NONE SEEN

## 2023-08-22 LAB — PREGNANCY, URINE: Preg Test, Ur: NEGATIVE

## 2023-08-22 MED ORDER — CEFTRIAXONE SODIUM 1 G IJ SOLR
1.0000 g | Freq: Once | INTRAMUSCULAR | Status: AC
  Administered 2023-08-22: 1 g via INTRAMUSCULAR
  Filled 2023-08-22: qty 10

## 2023-08-22 MED ORDER — METRONIDAZOLE 500 MG PO TABS: 500.0000 mg | ORAL_TABLET | Freq: Two times a day (BID) | ORAL | 0 refills | Status: AC

## 2023-08-22 MED ORDER — DOXYCYCLINE HYCLATE 100 MG PO TABS
100.0000 mg | ORAL_TABLET | Freq: Once | ORAL | Status: AC
  Administered 2023-08-22: 100 mg via ORAL
  Filled 2023-08-22: qty 1

## 2023-08-22 MED ORDER — DOXYCYCLINE HYCLATE 100 MG PO CAPS: 100.0000 mg | ORAL_CAPSULE | Freq: Two times a day (BID) | ORAL | 0 refills | Status: AC

## 2023-08-22 MED ORDER — METRONIDAZOLE 500 MG PO TABS
500.0000 mg | ORAL_TABLET | Freq: Once | ORAL | Status: AC
Start: 2023-08-22 — End: 2023-08-22
  Administered 2023-08-22: 500 mg via ORAL
  Filled 2023-08-22: qty 1

## 2023-08-22 NOTE — ED Notes (Signed)
Reviewed AVS with patient, patient expressed understanding of directions, denies further questions at this time. 

## 2023-08-22 NOTE — Discharge Instructions (Signed)
As discussed, we are treating you for pelvic inflammatory disease.  You have been given  your first dose of antibiotics in the ED tonight.  Take doxycycline and metronidazole twice a day for the next 14 days.  Treat sexual partners within the past 60 days.  Abstain from intercourse until 7 days after therapy is initiated and symptom resolution and partners have been treated.  Get help right away if: You have more pain in the belly area. Your symptoms are getting worse. You are not better in 72 hours with treatment.

## 2023-08-22 NOTE — ED Triage Notes (Signed)
Pt reports doctor gave her abx for UTI, states it was a 5 day RX, finishes it today but "I haven't seen any relief." Requesting testing for STI/ UTI "just to make sure." Unsure of pregnancy status

## 2023-08-22 NOTE — ED Provider Notes (Signed)
[place] EMERGENCY DEPARTMENT AT [facility] Provider Note   CSN: [id number] Arrival date & time: 08/22/23  1429     History  Chief Complaint  Patient presents with   Urinary Tract Infection    Julie Becker is a 22 y.o. female with a history of diabetes, eczema, and anxiety presents the ED today for suprapubic discomfort.  Patient reports suprapubic discomfort for the past week or so.  She was evaluated by her PCP and given antibiotic to treat UTI.  She states that she come pleated the 5-day course of antibiotics and still has not felt any relief.  She states that she feels urinary urgency but denies dysuria.  She states that she has brownish discharge and suprapubic, discomfort worse she moves.  She states that she has 2 different sexual partners and rarely uses condoms.  Denies any flank pain, fevers, or vaginal bleeding.  No other complaints or concerns at this time.    Home Medications Prior to Admission medications   Medication Sig Start Date End Date Taking? Authorizing Provider  doxycycline (VIBRAMYCIN) 100 MG capsule Take 1 capsule (100 mg total) by mouth 2 (two) times daily for 14 days. 08/22/23 09/05/23 Yes Maxwell Marion, PA-C  metroNIDAZOLE (FLAGYL) 500 MG tablet Take 1 tablet (500 mg total) by mouth 2 (two) times daily for 14 days. 08/22/23 09/05/23 Yes Maxwell Marion, PA-C  Dulaglutide (TRULICITY [place]) Inject into the skin.    [provider]  ferrous sulfate 325 (65 FE) MG tablet Take 1 tablet by mouth 2 (two) times daily.    [provider]  fluconazole (DIFLUCAN) 150 MG tablet Take 1 now and 1 in 3 days 01/07/23   Adline Potter, NP  fluticasone (FLONASE) 50 MCG/ACT nasal spray 1 spray by Each Nare route daily. Patient not taking: Reported on 10/29/2022 04/08/18   [provider]  ibuprofen (ADVIL) 600 MG tablet Take 1 tablet (600 mg total) by mouth every 6 (six) hours as needed. 06/28/23   Renne Crigler, PA-C   Levonorgestrel-Ethinyl Estradiol (AMETHIA) 0.15-0.03 &0.01 MG tablet 1 tablet 11/27/21   [provider]  levothyroxine (SYNTHROID) 150 MCG tablet Take 150 mcg by mouth every morning. 02/27/22   [provider]  metFORMIN (GLUCOPHAGE) 500 MG tablet     [provider]  sertraline (ZOLOFT) 50 MG tablet TAKE 1 TABLET BY MOUTH EVERY DAY FOR 30 DAYS    [provider]  Vitamin D, Ergocalciferol, (DRISDOL) 1.25 MG (50000 UNIT) CAPS capsule Take 50,000 Units by mouth once a week. 12/02/21   [provider]      Allergies    Orange oil    Review of Systems   Review of Systems  Genitourinary:  Positive for pelvic pain.  All other systems reviewed and are negative.   Physical Exam Updated Vital Signs BP (!) 144/83   Pulse 85   Temp 98.2 F (36.8 C) (Oral)   Resp 20   SpO2 99%  Physical Exam Vitals and nursing note reviewed. Exam conducted with a chaperone present.  Constitutional:      Appearance: Normal appearance.  HENT:     Head: Normocephalic and atraumatic.     Mouth/Throat:     Mouth: Mucous membranes are moist.  Eyes:     Conjunctiva/sclera: Conjunctivae normal.     Pupils: Pupils are equal, round, and reactive to light.  Cardiovascular:     Rate and Rhythm: Normal rate and regular rhythm.     Pulses: Normal  pulses.     Heart sounds: Normal heart sounds.  Pulmonary:     Effort: Pulmonary effort is normal.     Breath sounds: Normal breath sounds.  Abdominal:     Palpations: Abdomen is soft.     Tenderness: There is abdominal tenderness. There is no right CVA tenderness, left CVA tenderness, guarding or rebound.     Comments: Suprapubic tenderness  Genitourinary:    General: Normal vulva.     Comments: Mucopurulent discharge present in vaginal vault with non-friable cervix on exam. Positive chandelier sign on bimanual exam. Musculoskeletal:        General: Normal range of motion.  Skin:    General: Skin is warm and dry.      Findings: No rash.  Neurological:     General: No focal deficit present.     Mental Status: She is alert.  Psychiatric:        Mood and Affect: Mood normal.        Behavior: Behavior normal.     ED Results / Procedures / Treatments   Labs (all labs ordered are listed, but only abnormal results are displayed) Labs Reviewed  URINALYSIS, ROUTINE W REFLEX MICROSCOPIC - Abnormal; Notable for the following components:      Result Value   Hgb urine dipstick SMALL (*)    All other components within normal limits  WET PREP, GENITAL  PREGNANCY, URINE  GC/CHLAMYDIA PROBE AMP ([place]) NOT AT [facility]    EKG None  Radiology No results found.  Procedures Procedures: not indicated.   Medications Ordered in ED Medications  cefTRIAXone (ROCEPHIN) injection 1 g (1 g Intramuscular Given 08/22/23 2147)  doxycycline (VIBRA-TABS) tablet 100 mg (100 mg Oral Given 08/22/23 2146)  metroNIDAZOLE (FLAGYL) tablet 500 mg (500 mg Oral Given 08/22/23 2146)    ED Course/ Medical Decision Making/ A&P                                 Medical Decision Making Amount and/or Complexity of Data Reviewed Labs: ordered.  Risk Prescription drug management.   This patient presents to the ED for concern of suprapubic discomfort, this involves an extensive number of treatment options, and is a complaint that carries with it a high risk of complications and morbidity.   Differential diagnosis includes: UTI, pyelonephritis, pregnancy, STD, etc.   Comorbidities  See HPI above   Additional History  Additional history obtained from prior records.   Lab Tests  I ordered and personally interpreted labs.  The pertinent results include:   UA negative for bacteria, nitrites, or leukocytes Negative urine pregnancy test Negative for trichomonas, yeast infection, or BV Gonorrhea and chlamydia results pending.   Problem List / ED Course / Critical Interventions / Medication Management  Suprapubic  discomfort I ordered medications including: Ceftriaxone, Doxycycline, and Flagyl for PID  I have reviewed the patients home medicines and have made adjustments as needed   Social Determinants of Health  Social connection   Test / Admission - Considered  Discussed findings with patient. She is hemodynamically stable and safe for discharge home. Return precautions provided.       Final Clinical Impression(s) / ED Diagnoses Final diagnoses:  PID (acute pelvic inflammatory disease)    Rx / DC Orders ED Discharge Orders          Ordered    doxycycline (VIBRAMYCIN) 100 MG capsule  2 times daily  08/22/23 2139    metroNIDAZOLE (FLAGYL) 500 MG tablet  2 times daily        08/22/23 2139              Maxwell Marion, PA-C 08/22/23 2149    Coral Spikes, DO 08/22/23 2318

## 2023-08-25 LAB — GC/CHLAMYDIA PROBE AMP (~~LOC~~) NOT AT ARMC
Chlamydia: NEGATIVE
Comment: NEGATIVE
Comment: NORMAL
Neisseria Gonorrhea: NEGATIVE

## 2023-08-26 ENCOUNTER — Encounter: Payer: Self-pay | Admitting: Adult Health

## 2023-08-26 ENCOUNTER — Ambulatory Visit: Payer: PRIVATE HEALTH INSURANCE | Admitting: Adult Health

## 2023-08-26 VITALS — BP 145/95 | HR 96 | Ht 62.0 in | Wt 320.8 lb

## 2023-08-26 DIAGNOSIS — N3941 Urge incontinence: Secondary | ICD-10-CM

## 2023-08-26 DIAGNOSIS — R3915 Urgency of urination: Secondary | ICD-10-CM

## 2023-08-26 DIAGNOSIS — R1030 Lower abdominal pain, unspecified: Secondary | ICD-10-CM

## 2023-08-26 DIAGNOSIS — R350 Frequency of micturition: Secondary | ICD-10-CM

## 2023-08-26 LAB — POCT URINALYSIS DIPSTICK OB
Blood, UA: NEGATIVE
Glucose, UA: NEGATIVE
Nitrite, UA: NEGATIVE

## 2023-08-26 MED ORDER — PHENAZOPYRIDINE HCL 200 MG PO TABS: 200.0000 mg | ORAL_TABLET | Freq: Three times a day (TID) | ORAL | 0 refills | Status: DC | PRN

## 2023-08-26 NOTE — Progress Notes (Signed)
  Subjective:     Patient ID: Julie Becker, female   DOB: 07/01/2001, 22 y.o.   MRN: [id number]  HPI Julie Becker is a 22 year old black female,with SO, G0P0, in complaining of urinary frequency and urgency and urge incontinence, has some low back pain , was treated for UTI recently. Had negative STD testing too.   Last pap was 11/2021  PCP is [facility]   Review of Systems +urinary frequency  + urgency  +urge incontinence,  some low back pain , was treated for UTI recently   Reviewed past medical,surgical, social and family history. Reviewed medications and allergies.  Objective:   Physical Exam BP (!) 145/95 (BP Location: Left Arm, Patient Position: Sitting, Cuff Size: Large)   Pulse 96   Ht 5\' 2"  (1.575 m)   Wt (!) 320 lb 12.8 oz (145.5 kg)   LMP 07/29/2023 (Exact Date)   BMI 58.68 kg/m  urine dipstick was 2+protein and trace leuks Skin warm and dry.Pelvic: external genitalia is normal in appearance no lesions, vagina: pink,urethra has no lesions or masses noted,mildly tender, cervix:smooth, uterus: normal size, shape and contour, non tender, no masses felt, adnexa: no masses or tenderness noted. Bladder is non tender and no masses felt.  Fall risk is low  Upstream - 08/26/23 1622       Pregnancy Intention Screening   Does the patient want to become pregnant in the next year? No    Would the patient like to discuss contraceptive options today? No      Contraception Wrap Up   Current Method Oral Contraceptive    End Method Oral Contraceptive    Contraception Counseling Provided No            Examination chaperoned by Faith Rogue LPN     Assessment:     1. Urinary urgency - POC Urinalysis Dipstick OB  2. Urinary frequency Will rx pyridium 20 mg 1 tid prn Meds ordered this encounter  Medications   phenazopyridine (PYRIDIUM) 200 MG tablet    Sig: Take 1 tablet (200 mg total) by mouth 3 (three) times daily as needed for pain.    Dispense:  10  tablet    Refill:  0    Order Specific Question:   Supervising Provider    Answer:   Lazaro Arms [[id number]]   She may have some IC symptoms  - POC Urinalysis Dipstick OB Call me Friday and let me know how you are feeling  3. Lower abdominal pain - POC Urinalysis Dipstick OB  4. Urge incontinence of urine Try to void often     Plan:     Follow  up 09/08/23 for ROS if symptoms persist, may refer to urology

## 2023-09-08 ENCOUNTER — Encounter: Payer: Self-pay | Admitting: Adult Health

## 2023-09-08 ENCOUNTER — Ambulatory Visit (INDEPENDENT_AMBULATORY_CARE_PROVIDER_SITE_OTHER): Payer: Self-pay | Admitting: Adult Health

## 2023-09-08 VITALS — BP 126/88 | HR 90 | Ht 62.0 in | Wt 320.0 lb

## 2023-09-08 DIAGNOSIS — R350 Frequency of micturition: Secondary | ICD-10-CM

## 2023-09-08 DIAGNOSIS — N3941 Urge incontinence: Secondary | ICD-10-CM

## 2023-09-08 DIAGNOSIS — R1030 Lower abdominal pain, unspecified: Secondary | ICD-10-CM

## 2023-09-08 NOTE — Progress Notes (Signed)
  Subjective:     Patient ID: Julie Becker, female   DOB: 2000-12-20, 22 y.o.   MRN: [id number]  HPI Julie Becker is a 22 year old black female,with SO, G0P0, back follow up on taking pyridium and feels much better, no pain, urinary frequency or UI.  PCP is [facility]  Review of Systems Denies abdominal pain, urinary frequency or UI now Reviewed past medical,surgical, social and family history. Reviewed medications and allergies.     Objective:   Physical Exam BP 126/88 (BP Location: Left Arm, Patient Position: Sitting, Cuff Size: Normal) Comment (BP Location): lower arm  Pulse 90   Ht 5\' 2"  (1.575 m)   Wt (!) 320 lb (145.2 kg)   LMP 07/29/2023 (Exact Date)   BMI 58.53 kg/m     Skin warm and dry.  Lungs: clear to ausculation bilaterally. Cardiovascular: regular rate and rhythm.   Upstream - 09/08/23 1617       Pregnancy Intention Screening   Does the patient want to become pregnant in the next year? No    Does the patient's partner want to become pregnant in the next year? No    Would the patient like to discuss contraceptive options today? No      Contraception Wrap Up   Current Method Oral Contraceptive    End Method Oral Contraceptive    Contraception Counseling Provided No             Assessment:     1. Urinary frequency Resolved after taking pyridium, if returns let me know   2. Urge incontinence of urine Resolved   3. Lower abdominal pain Resolved      Plan:      Follow up  prn

## 2023-10-26 NOTE — Progress Notes (Deleted)
 NEUROLOGY CONSULTATION NOTE  Julie Becker MRN: [id number] DOB: 2001/06/13  Referring provider: Tillman Collet, FNP Primary care provider: Tillman Collet, FNP  Reason for consult:  migraines  Assessment/Plan:   ***   Subjective:  Julie Becker is a 23 year old ***-handed female DM 2, hypothyroidism, and depression who presents for migraines.  History supplemented by referring provider's note.  Onset:  *** Location:  *** Quality:  *** Intensity:  ***.  *** denies new headache, thunderclap headache or severe headache that wakes *** from sleep. Aura:  *** Prodrome:  *** Postdrome:  *** Associated symptoms:  ***.  *** denies associated unilateral numbness or weakness. Duration:  *** Frequency:  *** Frequency of abortive medication: *** Triggers:  *** Relieving factors:  *** Activity:  ***  Past NSAIDS/analgesics:  ibuprofen , naproxen  Past abortive triptans:  none Past abortive ergotamine:  *** Past muscle relaxants:  Robaxin  Past anti-emetic:  *** Past antihypertensive medications:  *** Past antidepressant medications:  sertraline Past anticonvulsant medications:  *** Past anti-CGRP:  *** Other past therapies:  ***  Current NSAIDS/analgesics:  none Current triptans:  sumatriptan 25mg  Current ergotamine:  none Current anti-emetic:  none Current muscle relaxants:  none Current Antihypertensive medications:  none Current Antidepressant medications:  none Current Anticonvulsant medications:  none Current anti-CGRP:  none Other therapy:  none Birth control:  *** Other medications:  levothyroxine, hydroxyzine, metformin, Ozempic   Caffeine:  *** Alcohol:  *** Smoker:  *** Diet:  *** Exercise:  *** Depression:  ***; Anxiety:  *** Other pain:  *** Sleep hygiene:  *** Family history of headache:  ***      PAST MEDICAL HISTORY: Past Medical History:  Diagnosis Date   Anxiety    Depression    Diabetes (HCC)    Eczema    Hashimoto's disease      PAST SURGICAL HISTORY: Past Surgical History:  Procedure Laterality Date   HERNIA REPAIR      MEDICATIONS: Current Outpatient Medications on File Prior to Visit  Medication Sig Dispense Refill   cyclobenzaprine (FLEXERIL) 5 MG tablet Take 5 mg by mouth every 8 (eight) hours as needed.     famotidine (PEPCID) 20 MG tablet Take by mouth.     ferrous sulfate 325 (65 FE) MG tablet Take 1 tablet by mouth 2 (two) times daily.     fluticasone  (FLONASE ) 50 MCG/ACT nasal spray 1 spray by Each Nare route daily. (Patient not taking: Reported on 10/29/2022)     ibuprofen  (ADVIL ) 600 MG tablet Take 1 tablet (600 mg total) by mouth every 6 (six) hours as needed. (Patient not taking: Reported on 08/26/2023) 20 tablet 0   Levonorgestrel-Ethinyl Estradiol (AMETHIA) 0.15-0.03 &0.01 MG tablet 1 tablet     levothyroxine (SYNTHROID) 150 MCG tablet Take 150 mcg by mouth every morning.     meloxicam (MOBIC) 7.5 MG tablet Take 7.5 mg by mouth daily.     metFORMIN (GLUCOPHAGE) 500 MG tablet      OZEMPIC, 0.25 OR 0.5 MG/DOSE, 2 MG/3ML SOPN Inject 0.5 mg into the skin once a week.     pantoprazole  (PROTONIX ) 20 MG tablet Take 1 tablet by mouth daily.     phenazopyridine  (PYRIDIUM ) 200 MG tablet Take 1 tablet (200 mg total) by mouth 3 (three) times daily as needed for pain. (Patient not taking: Reported on 09/08/2023) 10 tablet 0   sertraline (ZOLOFT) 50 MG tablet TAKE 1 TABLET BY MOUTH EVERY DAY FOR 30 DAYS (Patient not taking: Reported on 08/26/2023)  SUMAtriptan (IMITREX) 25 MG tablet Take 25 mg by mouth.     Vitamin D, Ergocalciferol, (DRISDOL) 1.25 MG (50000 UNIT) CAPS capsule Take 50,000 Units by mouth once a week.     No current facility-administered medications on file prior to visit.    ALLERGIES: Allergies  Allergen Reactions   Orange Oil Itching    FAMILY HISTORY: Family History  Problem Relation Age of Onset   Diabetes Paternal Grandmother    Hypertension Maternal Grandmother     Diabetes Maternal Grandmother    Heart disease Maternal Grandmother    Diabetes Maternal Grandfather    Dementia Maternal Grandfather    Diabetes Father    Other Mother        thyroid removed   Anxiety disorder Sister    Depression Sister     Objective:  *** General: No acute distress.  Patient appears well-groomed.   Head:  Normocephalic/atraumatic Eyes:  fundi examined but not visualized Neck: supple, no paraspinal tenderness, full range of motion Heart: regular rate and rhythm Neurological Exam: Mental status: alert and oriented to person, place, and time, speech fluent and not dysarthric, language intact. Cranial nerves: CN I: not tested CN II: pupils equal, round and reactive to light, visual fields intact CN III, IV, VI:  full range of motion, no nystagmus, no ptosis CN V: facial sensation intact. CN VII: upper and lower face symmetric CN VIII: hearing intact CN IX, X: gag intact, uvula midline CN XI: sternocleidomastoid and trapezius muscles intact CN XII: tongue midline Bulk & Tone: normal, no fasciculations. Motor:  muscle strength 5/5 throughout Sensation:  Pinprick and vibratory sensation intact. Deep Tendon Reflexes:  2+ throughout,  toes downgoing.   Finger to nose testing:  Without dysmetria.   Gait:  Normal station and stride.  Romberg negative.    Thank you for allowing me to take part in the care of this patient.  [name], DO  CC: Tillman Collet, FNP

## 2023-10-27 ENCOUNTER — Encounter: Payer: Self-pay | Admitting: Neurology

## 2023-10-27 ENCOUNTER — Ambulatory Visit: Payer: Self-pay | Admitting: Neurology
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
## 2023-11-14 ENCOUNTER — Other Ambulatory Visit: Payer: Self-pay

## 2023-11-14 ENCOUNTER — Encounter: Payer: Self-pay | Admitting: Neurology

## 2023-11-14 ENCOUNTER — Ambulatory Visit (INDEPENDENT_AMBULATORY_CARE_PROVIDER_SITE_OTHER): Payer: No Typology Code available for payment source | Admitting: Neurology

## 2023-11-14 VITALS — BP 144/96 | HR 89 | Ht 62.0 in | Wt 332.0 lb

## 2023-11-14 DIAGNOSIS — R030 Elevated blood-pressure reading, without diagnosis of hypertension: Secondary | ICD-10-CM

## 2023-11-14 DIAGNOSIS — G43109 Migraine with aura, not intractable, without status migrainosus: Secondary | ICD-10-CM

## 2023-11-14 MED ORDER — ONDANSETRON HCL 4 MG PO TABS: 4.0000 mg | ORAL_TABLET | Freq: Three times a day (TID) | ORAL | 5 refills | Status: AC | PRN

## 2023-11-14 MED ORDER — TOPIRAMATE 50 MG PO TABS: 50.0000 mg | ORAL_TABLET | Freq: Every day | ORAL | 5 refills | Status: AC

## 2023-11-14 NOTE — Patient Instructions (Addendum)
Start topiramate 50mg  tablet:  Take 1/2 tablet at bedtime for one week, then increase to 1 tablet at bedtime.  If no improvement in 6 weeks, contact me and we can increase dose Take sumatriptan at EARLIEST onset of migraine.  May repeat after 2 hours.  Maximum 2 tablets in 24 hours. Take ondansetron for nausea Will refer to eye doctor for formal eye exam MRI of brain without contrast A referral to [facility] has been placed for your MRI someone will contact you directly to schedule your appt. They are located at [address] [facility]. Please contact them directly by calling [phone] with any questions regarding your referral.  Follow up 6 months.

## 2023-11-27 ENCOUNTER — Telehealth: Payer: Self-pay | Admitting: Neurology

## 2023-11-27 DIAGNOSIS — R030 Elevated blood-pressure reading, without diagnosis of hypertension: Secondary | ICD-10-CM

## 2023-11-27 DIAGNOSIS — G43109 Migraine with aura, not intractable, without status migrainosus: Secondary | ICD-10-CM

## 2023-11-27 NOTE — Telephone Encounter (Signed)
Left a message with the after hour service on  11-27-23   She was to have a referral to a Eye Dr and has not heard anything

## 2023-11-28 NOTE — Telephone Encounter (Signed)
Referral created and faxed to [facility]. Called patient to inform her that her referral was sent and provided patient with [facility] contact information.

## 2023-11-28 NOTE — Telephone Encounter (Signed)
Per Ila Mcgill referral was sent to [facility] eye care. Called [phone] and they have not received the referral.  Will refer patient [facility]  Phone#:[phone] Fax#[phone]

## 2023-12-08 ENCOUNTER — Telehealth: Payer: Self-pay | Admitting: Neurology

## 2023-12-08 NOTE — Telephone Encounter (Signed)
 Mychart message sent. Paperwork re faxed.

## 2023-12-08 NOTE — Telephone Encounter (Signed)
 [facility] states they have not gotten a referral yet for the pt. She would like it sent again.

## 2023-12-11 ENCOUNTER — Ambulatory Visit
Admission: RE | Admit: 2023-12-11 | Discharge: 2023-12-11 | Disposition: A | Discharge status: Home / Self Care | Payer: No Typology Code available for payment source | Source: Ambulatory Visit | Attending: Neurology | Admitting: Neurology

## 2023-12-11 DIAGNOSIS — G43109 Migraine with aura, not intractable, without status migrainosus: Secondary | ICD-10-CM

## 2023-12-24 ENCOUNTER — Other Ambulatory Visit: Payer: Self-pay

## 2023-12-24 ENCOUNTER — Emergency Department (HOSPITAL_COMMUNITY)
Admission: EM | Admit: 2023-12-24 | Discharge: 2023-12-25 | Disposition: A | Discharge status: Home / Self Care | Attending: Emergency Medicine | Admitting: Emergency Medicine

## 2023-12-24 ENCOUNTER — Encounter (HOSPITAL_COMMUNITY): Payer: Self-pay

## 2023-12-24 DIAGNOSIS — H00012 Hordeolum externum right lower eyelid: Secondary | ICD-10-CM | POA: Insufficient documentation

## 2023-12-24 DIAGNOSIS — J3089 Other allergic rhinitis: Secondary | ICD-10-CM

## 2023-12-24 DIAGNOSIS — Z7984 Long term (current) use of oral hypoglycemic drugs: Secondary | ICD-10-CM | POA: Diagnosis not present

## 2023-12-24 DIAGNOSIS — T7840XA Allergy, unspecified, initial encounter: Secondary | ICD-10-CM | POA: Insufficient documentation

## 2023-12-24 DIAGNOSIS — J302 Other seasonal allergic rhinitis: Secondary | ICD-10-CM | POA: Diagnosis not present

## 2023-12-24 NOTE — ED Triage Notes (Signed)
 Pov from home. Cc of allergic reaction x1.5 weeks. Says that her eyes are itchy and throat feels scratchy. Itching all over Recently moved in with a family member and said there is a dog there and dust. Started a new medication, Topamax, recently for migraines.  Took benadryl at 1200 and 6pm 50mg  each time. With no relief.

## 2023-12-25 MED ORDER — CETIRIZINE HCL 10 MG PO TABS: 10.0000 mg | ORAL_TABLET | Freq: Every day | ORAL | 0 refills | Status: AC

## 2023-12-25 NOTE — ED Provider Notes (Signed)
 [facility] AT [facility] Provider Note   CSN: [id number] Arrival date & time: 12/24/23  2345     History  Chief Complaint  Patient presents with   Allergic Reaction    Julie Becker is a 23 y.o. female.  HPI     This is a 23 year old female who presents with possible allergic reaction.  Patient reports 1 week history of worsening general body itchiness, scratchy eyes and scratchy throat.  She just moved in with a family member and symptoms started after she moved in.  There is a dog in the house which is new for the patient.  No known history of allergy to pet dander.  Denies any significant seasonal allergies.  Has noted some watery eyes.  Also noted a spot on her right lower lid that is slightly painful.  Patient called her doctor's office and was told to try Benadryl.  She states initially it helped but did not get relief ultimately.  She did start Topamax greater than 2 weeks ago.  No other changes in lotions, detergents, soaps.  Denies shortness of breath or systemic symptoms.  No rash.  Home Medications Prior to Admission medications   Medication Sig Start Date End Date Taking? Authorizing Provider  cetirizine (ZYRTEC ALLERGY) 10 MG tablet Take 1 tablet (10 mg total) by mouth daily. 12/25/23  Yes Tiasha Helvie, Mayer Masker, MD  cyclobenzaprine (FLEXERIL) 5 MG tablet Take 5 mg by mouth every 8 (eight) hours as needed. 08/18/23   [provider]  famotidine (PEPCID) 20 MG tablet Take by mouth. 03/25/23   [provider]  ferrous sulfate 325 (65 FE) MG tablet Take 1 tablet by mouth 2 (two) times daily.    [provider]  Levonorgestrel-Ethinyl Estradiol (AMETHIA) 0.15-0.03 &0.01 MG tablet 1 tablet 11/27/21   [provider]  levothyroxine (SYNTHROID) 150 MCG tablet Take 150 mcg by mouth every morning. 02/27/22   [provider]  meloxicam (MOBIC) 7.5 MG tablet Take 7.5 mg by mouth daily. 08/18/23   [provider]  metFORMIN (GLUCOPHAGE) 500 MG tablet     [provider]  ondansetron (ZOFRAN) 4 MG tablet Take 1 tablet (4 mg total) by mouth every 8 (eight) hours as needed. 11/14/23   Jaffe, Adam R, DO  OZEMPIC, 0.25 OR 0.5 MG/DOSE, 2 MG/3ML SOPN Inject 0.5 mg into the skin once a week. Patient not taking: Reported on 11/14/2023    [provider]  pantoprazole (PROTONIX) 20 MG tablet Take 1 tablet by mouth daily. 03/11/23   [provider]  SUMAtriptan (IMITREX) 25 MG tablet Take 25 mg by mouth. 03/03/23   [provider]  topiramate (TOPAMAX) 50 MG tablet Take 1 tablet (50 mg total) by mouth at bedtime. 11/14/23   Drema Dallas, DO  Vitamin D, Ergocalciferol, (DRISDOL) 1.25 MG (50000 UNIT) CAPS capsule Take 50,000 Units by mouth once a week. 12/02/21   [provider]      Allergies    Orange oil    Review of Systems   Review of Systems  Eyes:  Positive for pain, discharge and itching.  Skin:  Negative for rash.       Itching  All other systems reviewed and are negative.   Physical Exam Updated Vital Signs Pulse 99   Temp 98.3 F (36.8 C) (Oral)   Resp 16   Ht 1.575 m (5\' 2" )   Wt (!) 148.8 kg   SpO2 99%  BMI 59.99 kg/m  Physical Exam Vitals and nursing note reviewed.  Constitutional:      Appearance: She is well-developed. She is obese. She is not ill-appearing.  HENT:     Head: Normocephalic and atraumatic.     Nose: Nose normal.     Mouth/Throat:     Mouth: Mucous membranes are moist.     Comments: No exudate or erythema, posterior oropharynx well-visualized Eyes:     Pupils: Pupils are equal, round, and reactive to light.     Comments: Hordeolum noted right medial lower lid  Cardiovascular:     Rate and Rhythm: Normal rate and regular rhythm.     Heart sounds: Normal heart sounds.  Pulmonary:     Effort: Pulmonary effort is normal. No respiratory distress.  Abdominal:     Palpations: Abdomen is soft.   Musculoskeletal:     Cervical back: Neck supple.  Skin:    General: Skin is warm and dry.     Comments: No rash noted  Neurological:     Mental Status: She is alert and oriented to person, place, and time.  Psychiatric:        Mood and Affect: Mood normal.     ED Results / Procedures / Treatments   Labs (all labs ordered are listed, but only abnormal results are displayed) Labs Reviewed - No data to display  EKG None  Radiology No results found.  Procedures Procedures    Medications Ordered in ED Medications - No data to display  ED Course/ Medical Decision Making/ A&P                                 Medical Decision Making Risk OTC drugs.   This patient presents to the ED for concern of itching, this involves an extensive number of treatment options, and is a complaint that carries with it a high risk of complications and morbidity.  I considered the following differential and admission for this acute, potentially life threatening condition.  The differential diagnosis includes allergic reaction, seasonal allergies, environmental allergies, scabies or infestation  MDM:    This is a 23 year old female who presents with whole body itching, scratchy throat, and watery eyes.  She is nontoxic and vital signs are reassuring.  No signs or symptoms of anaphylaxis.  No rash.  No evidence of infestation or scabies.  Question whether she may have environmental or seasonal allergies given whole clinical picture.  She has a new exposure to a dog and new home environment.  We discussed supportive measures and starting Zyrtec daily.  We also discussed that she likely has a stye on her right eye.  She can apply warm compresses.  She has follow-up with primary physician on Monday.  At that time if she is not better she may need referral for allergy testing.  Low suspicion for acute reaction to medication.  (Labs, imaging, consults)  Labs: I Ordered, and personally interpreted labs.   The pertinent results include: None  Imaging Studies ordered: I ordered imaging studies including none I independently visualized and interpreted imaging. I agree with the radiologist interpretation  Additional history obtained from chart review.  External records from outside source obtained and reviewed including prior evaluations  Cardiac Monitoring: The patient was not maintained on a cardiac monitor.  If on the cardiac monitor, I personally viewed and interpreted the cardiac monitored which showed an underlying rhythm of: N/A  Reevaluation: After the interventions noted above, I reevaluated the patient and found that they have :stayed the same  Social Determinants of Health:  lives independently  Disposition: Discharge  Co morbidities that complicate the patient evaluation  Past Medical History:  Diagnosis Date   Anxiety    Depression    Diabetes (HCC)    Eczema    Hashimoto's disease      Medicines Meds ordered this encounter  Medications   cetirizine (ZYRTEC ALLERGY) 10 MG tablet    Sig: Take 1 tablet (10 mg total) by mouth daily.    Dispense:  30 tablet    Refill:  0    I have reviewed the patients home medicines and have made adjustments as needed  Problem List / ED Course: Problem List Items Addressed This Visit   None Visit Diagnoses       Environmental and seasonal allergies    -  Primary     Hordeolum externum of right lower eyelid                       Final Clinical Impression(s) / ED Diagnoses Final diagnoses:  Environmental and seasonal allergies  Hordeolum externum of right lower eyelid    Rx / DC Orders ED Discharge Orders          Ordered    cetirizine (ZYRTEC ALLERGY) 10 MG tablet  Daily        12/25/23 0008              Shon Baton, MD 12/25/23 [phone]

## 2023-12-25 NOTE — Discharge Instructions (Signed)
 You were seen today for allergic symptoms.  These are likely related to environmental or seasonal allergies.  Start Zyrtec daily.  If you develop rash, difficulty breathing, any new or worsening symptoms, you should be reevaluated.  Regarding your stye, apply warm compress daily.

## 2024-01-28 ENCOUNTER — Ambulatory Visit: Payer: Self-pay | Admitting: Neurology
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
## 2024-05-17 ENCOUNTER — Encounter: Payer: Self-pay | Admitting: Neurology

## 2024-05-17 ENCOUNTER — Ambulatory Visit: Payer: No Typology Code available for payment source | Admitting: Neurology

## 2024-05-21 ENCOUNTER — Telehealth: Payer: Self-pay | Admitting: Neurology

## 2024-05-21 NOTE — Telephone Encounter (Signed)
 We are writing to inform you that [facility], including all providers within this practice, will no longer be able to provide medical care to you.  This decision is the result of repeated missed appointments without adequate notice, which has disrupted our ability to provide timely and effective care to all patients. 05/17/24

## 2024-05-31 ENCOUNTER — Other Ambulatory Visit (HOSPITAL_COMMUNITY)
Admission: RE | Admit: 2024-05-31 | Discharge: 2024-05-31 | Disposition: A | Discharge status: Home / Self Care | Source: Ambulatory Visit | Attending: Obstetrics & Gynecology | Admitting: Obstetrics & Gynecology

## 2024-05-31 ENCOUNTER — Ambulatory Visit (INDEPENDENT_AMBULATORY_CARE_PROVIDER_SITE_OTHER): Admitting: *Deleted

## 2024-05-31 DIAGNOSIS — N898 Other specified noninflammatory disorders of vagina: Secondary | ICD-10-CM | POA: Insufficient documentation

## 2024-05-31 NOTE — Progress Notes (Signed)
   NURSE VISIT- VAGINITIS  SUBJECTIVE:  Julie Becker is a 23 y.o. G0P0000 GYN patientfemale here for a vaginal swab for vaginitis screening.  She reports the following symptoms: discharge described as dark and grey and vulvar itching for 2 days.  Has been on recent course of antibiotics.  Denies abnormal vaginal bleeding, significant pelvic pain, fever, or UTI symptoms.  OBJECTIVE:  There were no vitals taken for this visit.  Appears well, in no apparent distress  ASSESSMENT: Vaginal swab for vaginitis screening  PLAN: Self-collected vaginal probe for Gonorrhea, Chlamydia, Trichomonas, Bacterial Vaginosis, Yeast sent to lab Treatment: to be determined once results are received Follow-up as needed if symptoms persist/worsen, or new symptoms develop  Shyhiem Beeney  05/31/2024 2:32 PM

## 2024-06-02 ENCOUNTER — Ambulatory Visit: Payer: Self-pay | Admitting: Adult Health

## 2024-06-02 LAB — CERVICOVAGINAL ANCILLARY ONLY
Bacterial Vaginitis (gardnerella): POSITIVE — AB
Candida Glabrata: NEGATIVE
Candida Vaginitis: POSITIVE — AB
Chlamydia: NEGATIVE
Comment: NEGATIVE
Comment: NEGATIVE
Comment: NEGATIVE
Comment: NEGATIVE
Comment: NEGATIVE
Comment: NORMAL
Neisseria Gonorrhea: NEGATIVE
Trichomonas: NEGATIVE

## 2024-06-02 MED ORDER — FLUCONAZOLE 150 MG PO TABS: ORAL_TABLET | ORAL | 1 refills | Status: AC

## 2024-06-02 MED ORDER — METRONIDAZOLE 0.75 % VA GEL: 1.0000 | Freq: Every day | VAGINAL | 0 refills | Status: AC

## 2024-06-04 ENCOUNTER — Encounter: Payer: Self-pay | Admitting: Radiology

## 2024-08-16 ENCOUNTER — Encounter: Payer: Self-pay | Admitting: Radiology

## 2024-08-25 ENCOUNTER — Emergency Department (HOSPITAL_COMMUNITY)
Admission: EM | Admit: 2024-08-25 | Discharge: 2024-08-26 | Disposition: A | Discharge status: Home / Self Care | Attending: Emergency Medicine | Admitting: Emergency Medicine

## 2024-08-25 ENCOUNTER — Other Ambulatory Visit: Payer: Self-pay

## 2024-08-25 ENCOUNTER — Encounter (HOSPITAL_COMMUNITY): Payer: Self-pay

## 2024-08-25 DIAGNOSIS — O24319 Unspecified pre-existing diabetes mellitus in pregnancy, unspecified trimester: Secondary | ICD-10-CM | POA: Diagnosis not present

## 2024-08-25 DIAGNOSIS — Z3201 Encounter for pregnancy test, result positive: Secondary | ICD-10-CM | POA: Diagnosis present

## 2024-08-25 LAB — POC URINE PREG, ED: Preg Test, Ur: POSITIVE — AB

## 2024-08-25 NOTE — ED Triage Notes (Signed)
 Pt to ED requesting pregnancy test, pt says she has taken 3 at home and they were positive, says she is here to make sure tests were correct. Pt endorses unprotected sex and last menstrual cycle was shorter than normal about 10 days ago

## 2024-08-26 ENCOUNTER — Telehealth: Payer: Self-pay | Admitting: Advanced Practice Midwife

## 2024-08-26 ENCOUNTER — Other Ambulatory Visit: Payer: Self-pay

## 2024-08-26 ENCOUNTER — Encounter (HOSPITAL_COMMUNITY): Payer: Self-pay

## 2024-08-26 DIAGNOSIS — O209 Hemorrhage in early pregnancy, unspecified: Secondary | ICD-10-CM

## 2024-08-26 NOTE — ED Provider Notes (Signed)
 [facility]-EMERGENCY DEPT [facility] Emergency Department Provider Note MRN:  [id number]  Arrival date & time: 08/26/24     Chief Complaint   wants pregnancy test   History of Present Illness   Julie Becker is a 23 y.o. year-old female with a history of diabetes presenting to the ED with chief complaint of pregnancy test.  Positive pregnancy test at home, wants another 1 here.  Denies other complaints.  Review of Systems  A thorough review of systems was obtained and all systems are negative except as noted in the HPI and PMH.   Patient's Health History    Past Medical History:  Diagnosis Date   Anxiety    Depression    Diabetes (HCC)    Eczema    Hashimoto's disease     Past Surgical History:  Procedure Laterality Date   HERNIA REPAIR      Family History  Problem Relation Age of Onset   Diabetes Paternal Grandmother    Hypertension Maternal Grandmother    Diabetes Maternal Grandmother    Heart disease Maternal Grandmother    Diabetes Maternal Grandfather    Dementia Maternal Grandfather    Diabetes Father    Other Mother        thyroid removed   Anxiety disorder Sister    Depression Sister     Social History   Socioeconomic History   Marital status: Significant Other    Spouse name: Not on file   Number of children: 0   Years of education: Not on file   Highest education level: Not on file  Occupational History   Not on file  Tobacco Use   Smoking status: Never   Smokeless tobacco: Never  Vaping Use   Vaping status: Never Used  Substance and Sexual Activity   Alcohol use: Yes    Comment: occ   Drug use: Never   Sexual activity: Yes    Birth control/protection: Pill  Other Topics Concern   Not on file  Social History Narrative   Not on file   Social Drivers of Health   Financial Resource Strain: Low Risk  (12/27/2021)   Overall Financial Resource Strain (CARDIA)    Difficulty of Paying Living Expenses: Not very hard  Food Insecurity: No  Food Insecurity (12/27/2021)   Hunger Vital Sign    Worried About Running Out of Food in the Last Year: Never true    Ran Out of Food in the Last Year: Never true  Transportation Needs: No Transportation Needs (12/27/2021)   PRAPARE - Administrator, Civil Service (Medical): No    Lack of Transportation (Non-Medical): No  Physical Activity: Insufficiently Active (12/27/2021)   Exercise Vital Sign    Days of Exercise per Week: 3 days    Minutes of Exercise per Session: 20 min  Stress: Stress Concern Present (12/27/2021)   [employer] of Occupational Health - Occupational Stress Questionnaire    Feeling of Stress : To some extent  Social Connections: Moderately Integrated (12/27/2021)   Social Connection and Isolation Panel    Frequency of Communication with Friends and Family: More than three times a week    Frequency of Social Gatherings with Friends and Family: More than three times a week    Attends Religious Services: More than 4 times per year    Active Member of [employer] or Organizations: Yes    Attends Banker Meetings: More than 4 times per year    Marital Status: Never  married  Intimate Partner Violence: Not At Risk (12/27/2021)   Humiliation, Afraid, Rape, and Kick questionnaire    Fear of Current or Ex-Partner: No    Emotionally Abused: No    Physically Abused: No    Sexually Abused: No     Physical Exam   Vitals:   08/25/24 2225 08/26/24 0000  BP: (!) 156/109 (!) 150/95  Pulse: (!) 105 100  Resp: 18 17  Temp: 98.3 F (36.8 C) 98.2 F (36.8 C)  SpO2: 99% 99%    CONSTITUTIONAL: Well-appearing, NAD NEURO/PSYCH:  Alert and oriented x 3, no focal deficits EYES:  eyes equal and reactive ENT/NECK:  no LAD, no JVD CARDIO: Regular rate, well-perfused, normal S1 and S2 PULM:  CTAB no wheezing or rhonchi GI/GU:  non-distended, non-tender MSK/SPINE:  No gross deformities, no edema SKIN:  no rash, atraumatic   *Additional and/or pertinent  findings included in MDM below  Diagnostic and Interventional Summary    EKG Interpretation Date/Time:    Ventricular Rate:    PR Interval:    QRS Duration:    QT Interval:    QTC Calculation:   R Axis:      Text Interpretation:         Labs Reviewed  POC URINE PREG, ED - Abnormal; Notable for the following components:      Result Value   Preg Test, Ur POSITIVE (*)    All other components within normal limits    No orders to display    Medications - No data to display   Procedures  /  Critical Care Procedures  ED Course and Medical Decision Making  Initial Impression and Ddx Vitals normal, well-appearing in no acute distress with no other complaints.  Past medical/surgical history that increases complexity of ED encounter: Diabetes  Interpretation of Diagnostics hCG positive  Patient Reassessment and Ultimate Disposition/Management     Discharge  Patient management required discussion with the following services or consulting groups:  None  Complexity of Problems Addressed Acute complicated illness or Injury  Additional Data Reviewed and Analyzed Further history obtained from: Past medical history and medications listed in the EMR  Additional Factors Impacting ED Encounter Risk None  Julie Becker. Theadore, MD [facility] [email or web address]  Final Clinical Impressions(s) / ED Diagnoses     ICD-10-CM   1. Positive pregnancy test  Z32.01       ED Discharge Orders     None        Discharge Instructions Discussed with and Provided to Patient:    Discharge Instructions      You were evaluated in the Emergency Department and after careful evaluation, we did not find any emergent condition requiring admission or further testing in the hospital.  Your exam/testing today is overall reassuring.  Your test was positive for pregnancy.  Recommend follow-up with OB/GYN for further care.  Please return to  the Emergency Department if you experience any worsening of your condition.   Thank you for allowing us  to be a part of your care.      Theadore Julie HERO, MD 08/26/24 [phone]

## 2024-08-26 NOTE — Telephone Encounter (Signed)
 Patient called stated that she went to the ED and found out she is pregnant. Stated that she had a normal cycle in 10/10 and 11/4 spotted for 3 days. She also stated that she has fiborids and her periods are never the same and doesn't know how far she is and wants to know a lab order could be put in for tomorrow.

## 2024-08-26 NOTE — ED Notes (Addendum)
 Informed pt that pregnancy test was positive, just like the last 3 she took at home. No focal assessment applicable to this chief complaint

## 2024-08-26 NOTE — Discharge Instructions (Signed)
 You were evaluated in the Emergency Department and after careful evaluation, we did not find any emergent condition requiring admission or further testing in the hospital.  Your exam/testing today is overall reassuring.  Your test was positive for pregnancy.  Recommend follow-up with OB/GYN for further care.  Please return to the Emergency Department if you experience any worsening of your condition.   Thank you for allowing us  to be a part of your care.

## 2024-08-28 LAB — BETA HCG QUANT (REF LAB): hCG Quant: 288 m[IU]/mL

## 2024-08-30 ENCOUNTER — Ambulatory Visit: Payer: Self-pay | Admitting: Adult Health

## 2024-08-30 DIAGNOSIS — O209 Hemorrhage in early pregnancy, unspecified: Secondary | ICD-10-CM

## 2024-08-30 DIAGNOSIS — O039 Complete or unspecified spontaneous abortion without complication: Secondary | ICD-10-CM

## 2024-08-31 LAB — BETA HCG QUANT (REF LAB): hCG Quant: 54 m[IU]/mL

## 2024-09-02 LAB — BETA HCG QUANT (REF LAB): hCG Quant: 23 m[IU]/mL

## 2024-09-07 LAB — BETA HCG QUANT (REF LAB): hCG Quant: 2 m[IU]/mL

## 2024-10-27 ENCOUNTER — Other Ambulatory Visit (HOSPITAL_COMMUNITY): Payer: Self-pay | Admitting: Family Medicine

## 2024-10-27 ENCOUNTER — Ambulatory Visit (HOSPITAL_COMMUNITY)
Admission: RE | Admit: 2024-10-27 | Discharge: 2024-10-27 | Disposition: A | Discharge status: Home / Self Care | Source: Ambulatory Visit | Attending: Family Medicine | Admitting: Family Medicine

## 2024-10-27 DIAGNOSIS — S99919A Unspecified injury of unspecified ankle, initial encounter: Secondary | ICD-10-CM

## 2024-11-04 ENCOUNTER — Encounter: Payer: Self-pay | Admitting: Podiatry

## 2024-11-04 ENCOUNTER — Ambulatory Visit

## 2024-11-04 ENCOUNTER — Ambulatory Visit: Admitting: Podiatry

## 2024-11-04 ENCOUNTER — Telehealth: Payer: Self-pay | Admitting: Podiatry

## 2024-11-04 ENCOUNTER — Telehealth: Payer: Self-pay

## 2024-11-04 DIAGNOSIS — M7662 Achilles tendinitis, left leg: Secondary | ICD-10-CM | POA: Diagnosis not present

## 2024-11-04 DIAGNOSIS — M722 Plantar fascial fibromatosis: Secondary | ICD-10-CM | POA: Diagnosis not present

## 2024-11-04 MED ORDER — TRIAMCINOLONE ACETONIDE 10 MG/ML IJ SUSP
10.0000 mg | Freq: Once | INTRAMUSCULAR | Status: AC
  Administered 2024-11-04: 10 mg via INTRA_ARTICULAR

## 2024-11-04 MED ORDER — DICLOFENAC SODIUM 75 MG PO TBEC: 75.0000 mg | DELAYED_RELEASE_TABLET | Freq: Two times a day (BID) | ORAL | 2 refills | Status: AC

## 2024-11-04 NOTE — Telephone Encounter (Signed)
 Called insurance to verify if coverage was effective and we're in network. We are in network and effective date 06/20/2024.

## 2024-11-04 NOTE — Progress Notes (Signed)
 If subjective:   Patient ID: Julie Becker, female   DOB: 24 y.o.   MRN: [id number]   HPI Patient presents with a lot of pain in the left heel mostly on the bottom some on the back and patient states that the bottom has been very sore and hard for her to walk on.  Patient does have obesity which is certainly a complicating factor and did have a miscarriage in the recent past.  Patient does not smoke and likes to be active if possible   Review of Systems  All other systems reviewed and are negative.       Objective:  Physical Exam Vitals and nursing note reviewed.  Constitutional:      Appearance: She is well-developed.  Pulmonary:     Effort: Pulmonary effort is normal.  Musculoskeletal:        General: Normal range of motion.  Skin:    General: Skin is warm.  Neurological:     Mental Status: She is alert.     Neurovascular status intact muscle strength found to be adequate range of motion moderately reduced subtalar midtarsal joint moderate equinus condition noted.  Left heel the pain is mostly of the plantar heel it is mildly in the posterior but the plantar appears worse and she states that she was walking on her toes for a period of time.  Patient has good digital perfusion well oriented x 3     Assessment:  Acute plantar fasciitis left foot with moderate Achilles tendinitis noted with flatfoot deformity     Plan:  H&P reviewed at this point I want to try to address the acute inflammation that this patient is experiencing.  I did sterile prep I injected the plantar fascia at insertion 3 mg Kenalog  5 mg Xylocaine and I then applied fascial brace to hold up the arch placed on oral diclofenac  and reappoint to recheck.  X-rays indicate significant collapse medial longitudinal arch probably due to the obesity issues she is experiencing

## 2024-11-04 NOTE — Telephone Encounter (Signed)
 Called patient to confirm pregnancy. She is not at this time. Nothing else follows MLP,CCMA

## 2024-11-04 NOTE — Patient Instructions (Signed)

## 2024-11-05 ENCOUNTER — Encounter: Payer: Self-pay | Admitting: Emergency Medicine

## 2024-11-05 ENCOUNTER — Ambulatory Visit
Admission: EM | Admit: 2024-11-05 | Discharge: 2024-11-05 | Disposition: A | Discharge status: Home / Self Care | Attending: Physician Assistant | Admitting: Physician Assistant

## 2024-11-05 DIAGNOSIS — J209 Acute bronchitis, unspecified: Secondary | ICD-10-CM | POA: Diagnosis not present

## 2024-11-05 LAB — POC COVID19/FLU A&B COMBO
Covid Antigen, POC: NEGATIVE
Influenza A Antigen, POC: NEGATIVE
Influenza B Antigen, POC: NEGATIVE

## 2024-11-05 MED ORDER — AZITHROMYCIN 250 MG PO TABS: ORAL_TABLET | ORAL | 0 refills | Status: AC

## 2024-11-05 MED ORDER — BENZONATATE 100 MG PO CAPS: 100.0000 mg | ORAL_CAPSULE | Freq: Three times a day (TID) | ORAL | Status: AC | PRN

## 2024-11-05 MED ORDER — BENZONATATE 200 MG PO CAPS: 200.0000 mg | ORAL_CAPSULE | Freq: Three times a day (TID) | ORAL | 0 refills | Status: AC | PRN

## 2024-11-05 NOTE — ED Triage Notes (Signed)
 Sore throat, cough, heaviness in chest, body aches x 2 days.

## 2024-11-05 NOTE — ED Provider Notes (Addendum)
 " [facility]    CSN: [id number] Arrival date & time: 11/05/24  1647      History   Chief Complaint No chief complaint on file.   HPI Julie Becker is a 24 y.o. female.   Patient complains of a cough and congestion.  Patient states that she has had the cough for the past 2 days.  Patient reports she has had some bodyaches.  Patient has not had fever.  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  Patient reports that she has had bronchitis in the past and this feels the same.  Patient reports she has had pneumonia in the past  The history is provided by the patient. No language interpreter was used.    Past Medical History:  Diagnosis Date   Anxiety    Depression    Diabetes (HCC)    Eczema    Hashimoto's disease     Patient Active Problem List   Diagnosis Date Noted   Urge incontinence of urine 08/26/2023   Lower abdominal pain 08/26/2023   Urinary urgency 08/26/2023   Urinary frequency 08/26/2023   Screening examination for STD (sexually transmitted disease) 05/13/2022   Irregular bleeding 05/13/2022   Dyspareunia, female 05/13/2022   Fibroid 04/01/2022   Contraception management 04/01/2022    Past Surgical History:  Procedure Laterality Date   HERNIA REPAIR      OB History     Gravida  1   Para  0   Term  0   Preterm  0   AB  0   Living  0      SAB  0   IAB  0   Ectopic  0   Multiple  0   Live Births  0            Home Medications    Prior to Admission medications  Medication Sig Start Date End Date Taking? Authorizing Provider  cetirizine  (ZYRTEC  ALLERGY) 10 MG tablet Take 1 tablet (10 mg total) by mouth daily. 12/25/23   Horton, Charmaine FALCON, MD  cyclobenzaprine (FLEXERIL) 5 MG tablet Take 5 mg by mouth every 8 (eight) hours as needed. 08/18/23   [provider]  diclofenac  (VOLTAREN ) 75 MG EC tablet Take 1 tablet (75 mg total) by mouth 2 (two) times  daily. 11/04/24   Magdalen Pasco RAMAN, DPM  famotidine (PEPCID) 20 MG tablet Take by mouth. 03/25/23   [provider]  ferrous sulfate 325 (65 FE) MG tablet Take 1 tablet by mouth 2 (two) times daily.    [provider]  fluconazole  (DIFLUCAN ) 150 MG tablet Take 1 now and 1 in 3 days 06/02/24   Signa Delon DELENA, NP  Levonorgestrel-Ethinyl Estradiol (AMETHIA) 0.15-0.03 &0.01 MG tablet 1 tablet 11/27/21   [provider]  levothyroxine (SYNTHROID) 150 MCG tablet Take 150 mcg by mouth every morning. 02/27/22   [provider]  meloxicam (MOBIC) 7.5 MG tablet Take 7.5 mg by mouth daily. 08/18/23   [provider]  metFORMIN (GLUCOPHAGE) 500 MG tablet     [provider]  metroNIDAZOLE  (METROGEL ) 0.75 % vaginal gel Place 1 Applicatorful vaginally at bedtime. 06/02/24   Signa Delon DELENA, NP  ondansetron  (ZOFRAN ) 4 MG tablet Take 1 tablet (4 mg total) by mouth every 8 (eight) hours as needed. 11/14/23   Jaffe, Adam R, DO  OZEMPIC, 0.25 OR 0.5 MG/DOSE, 2 MG/3ML SOPN Inject  0.5 mg into the skin once a week. Patient not taking: Reported on 11/14/2023    [provider]  pantoprazole  (PROTONIX ) 20 MG tablet Take 1 tablet by mouth daily. 03/11/23   [provider]  SUMAtriptan (IMITREX) 25 MG tablet Take 25 mg by mouth. 03/03/23   [provider]  topiramate  (TOPAMAX ) 50 MG tablet Take 1 tablet (50 mg total) by mouth at bedtime. 11/14/23   Skeet Juliene SAUNDERS, DO  Vitamin D, Ergocalciferol, (DRISDOL) 1.25 MG (50000 UNIT) CAPS capsule Take 50,000 Units by mouth once a week. 12/02/21   [provider]    Family History Family History  Problem Relation Age of Onset   Diabetes Paternal Grandmother    Hypertension Maternal Grandmother    Diabetes Maternal Grandmother    Heart disease Maternal Grandmother    Diabetes Maternal Grandfather    Dementia Maternal Grandfather    Diabetes Father    Other Mother        thyroid removed    Anxiety disorder Sister    Depression Sister     Social History Social History[1]   Allergies   Orange oil   Review of Systems Review of Systems  All other systems reviewed and are negative.    Physical Exam Triage Vital Signs ED Triage Vitals  Encounter Vitals Group     BP 11/05/24 1654 (!) 172/90     Girls Systolic BP Percentile --      Girls Diastolic BP Percentile --      Boys Systolic BP Percentile --      Boys Diastolic BP Percentile --      Pulse Rate 11/05/24 1654 (!) 107     Resp 11/05/24 1654 20     Temp 11/05/24 1654 99.7 F (37.6 C)     Temp Source 11/05/24 1654 Oral     SpO2 11/05/24 1654 96 %     Weight --      Height --      Head Circumference --      Peak Flow --      Pain Score 11/05/24 1655 7     Pain Loc --      Pain Education --      Exclude from Growth Chart --    No data found.  Updated Vital Signs BP (!) 172/90 (BP Location: Right Wrist)   Pulse (!) 107   Temp 99.7 F (37.6 C) (Oral)   Resp 20   LMP 10/12/2024 (Approximate)   SpO2 96%   Visual Acuity Right Eye Distance:   Left Eye Distance:   Bilateral Distance:    Right Eye Near:   Left Eye Near:    Bilateral Near:     Physical Exam Vitals and nursing note reviewed.  Constitutional:      Appearance: She is well-developed.  HENT:     Head: Normocephalic.     Right Ear: Tympanic membrane normal.     Left Ear: Tympanic membrane normal.     Mouth/Throat:     Mouth: Mucous membranes are moist.  Eyes:     Pupils: Pupils are equal, round, and reactive to light.  Cardiovascular:     Rate and Rhythm: Normal rate.  Pulmonary:     Effort: Pulmonary effort is normal.     Breath sounds: Rhonchi present.  Abdominal:     General: Abdomen is flat. There is no distension.  Musculoskeletal:        General: Normal range of motion.  Cervical back: Normal range of motion.  Skin:    General: Skin is warm.  Neurological:     General: No focal deficit present.     Mental  Status: She is alert and oriented to person, place, and time.      UC Treatments / Results  Labs (all labs ordered are listed, but only abnormal results are displayed) Labs Reviewed  POC COVID19/FLU A&B COMBO - Normal    EKG   Radiology DG Foot 2 Views Left Result Date: 11/04/2024 Please see detailed radiograph report in office note.   Procedures Procedures (including critical care time)  Medications Ordered in UC Medications - No data to display  Initial Impression / Assessment and Plan / UC Course  I have reviewed the triage vital signs and the nursing notes.  Pertinent labs & imaging results that were available during my care of the patient were reviewed by me and considered in my medical decision making (see chart for details).     Influenza and COVID are negative.  Patient has a rhonchorous cough.  Unfortunately I do not have x-ray available here today.  I am concerned the patient could have a bronchitis or early pneumonia.  Patient is given a prescription for Zithromax.  Tessalon Perles.  Patient is advised to return if symptoms worsen or change. Final Clinical Impressions(s) / UC Diagnoses   Final diagnoses:  Acute bronchitis, unspecified organism     Discharge Instructions      Return if any problems.  See your Physician for recheck if symptoms persist     ED Prescriptions   None    PDMP not reviewed this encounter. An After Visit Summary was printed and given to the patient.          Flint Sonny POUR, PA-C 11/05/24 1815     [1]  Social History Tobacco Use   Smoking status: Never   Smokeless tobacco: Never  Vaping Use   Vaping status: Never Used  Substance Use Topics   Alcohol use: Yes    Comment: occ   Drug use: Never     Flint Sonny POUR, PA-C 11/05/24 1837  "

## 2024-11-05 NOTE — Discharge Instructions (Addendum)
Return if any problems. See your Physician for recheck if symptoms persist  

## 2024-11-18 ENCOUNTER — Ambulatory Visit: Admitting: Podiatry

## 2024-11-18 ENCOUNTER — Encounter: Payer: Self-pay | Admitting: Podiatry

## 2024-11-18 DIAGNOSIS — M722 Plantar fascial fibromatosis: Secondary | ICD-10-CM

## 2024-11-18 DIAGNOSIS — M7662 Achilles tendinitis, left leg: Secondary | ICD-10-CM

## 2024-11-18 NOTE — Progress Notes (Signed)
 Subjective:   Patient ID: Julie Becker, female   DOB: 24 y.o.   MRN: [id number]   HPI Patient states she still having a lot of pain in her heel does not want an injection at the current time and patient is having trouble when working long shifts with being able to walk with any degree of comfort.  Patient does have profound obesity which is complicating factor   ROS      Objective:  Physical Exam  Acute discomfort still noted plantar aspect of the heel with improvement but still painful when I pressed into the area with also discomfort in the Achilles.  Patient states that the pain is worse when getting up in the morning after periods of sitting     Assessment:  Acute plantar fasciitis left that remains a problem with flatfoot deformity and obesity is complicating factors     Plan:  H&P reviewed condition discussed at great length and I have recommended orthotics to try to offload weight from the heel and patient wants these made and is scheduled and has customized orthotic devices made with professional.  I then went ahead and dispensed night splint that I want her to sleep in and use for periods of time to try to reduce the stress that is occurring and patient was given all instructions and fitted properly for night splint usage.  Reappoint 4 weeks to reevaluate may require further injection but were trying to hold off

## 2024-12-16 ENCOUNTER — Ambulatory Visit: Admitting: Podiatry
